# Patient Record
Sex: Male | Born: 1945 | Race: White | Hispanic: No | Marital: Married | Smoking: Never smoker
Health system: Southern US, Community
[De-identification: ages and names within clinical notes are randomized; demographics above are authoritative.]

## PROBLEM LIST (undated history)

## (undated) DIAGNOSIS — I4891 Unspecified atrial fibrillation: Secondary | ICD-10-CM

## (undated) DIAGNOSIS — J45909 Unspecified asthma, uncomplicated: Secondary | ICD-10-CM

## (undated) DIAGNOSIS — M5430 Sciatica, unspecified side: Secondary | ICD-10-CM

## (undated) DIAGNOSIS — Z98811 Dental restoration status: Secondary | ICD-10-CM

## (undated) DIAGNOSIS — C61 Malignant neoplasm of prostate: Secondary | ICD-10-CM

## (undated) DIAGNOSIS — I499 Cardiac arrhythmia, unspecified: Secondary | ICD-10-CM

## (undated) HISTORY — PX: APPENDECTOMY: SHX54

## (undated) HISTORY — PX: PROSTATECTOMY: SHX69

## (undated) HISTORY — PX: NOSE SURGERY: SHX723

## (undated) HISTORY — PX: COLONOSCOPY: SHX174

## (undated) HISTORY — PX: HERNIA REPAIR: SHX51

## (undated) HISTORY — PX: CHOLECYSTECTOMY: SHX55

---

## 2015-09-19 ENCOUNTER — Telehealth: Payer: Self-pay | Admitting: Gastroenterology

## 2015-09-19 NOTE — Telephone Encounter (Signed)
colonoscopy

## 2015-09-24 NOTE — Telephone Encounter (Signed)
LVM for pt to return my call to schedule colonoscopy.  

## 2015-09-26 NOTE — Telephone Encounter (Signed)
CAnt reach patient for colon triage, Will mail letter for patient to contact office

## 2015-10-18 ENCOUNTER — Telehealth: Payer: Self-pay | Admitting: Gastroenterology

## 2015-10-18 NOTE — Telephone Encounter (Signed)
colonoscopy

## 2015-10-25 ENCOUNTER — Other Ambulatory Visit: Payer: Self-pay

## 2015-10-25 ENCOUNTER — Telehealth: Payer: Self-pay

## 2015-10-25 NOTE — Telephone Encounter (Signed)
Pt scheduled for colonoscopy on Monday, Jan 19th in [facility]. Instructs/rx mailed. Please see if precert is required. Thanks!!

## 2015-10-25 NOTE — Telephone Encounter (Signed)
Gastroenterology Pre-Procedure Review  Request Date: 11/05/15 Requesting Physician: Dr. Hoy Morn  PATIENT REVIEW QUESTIONS: The patient responded to the following health history questions as indicated:    1. Are you having any GI issues? no 2. Do you have a personal history of Polyps? yes (Colon polyps) 3. Do you have a family history of Colon Cancer or Polyps? yes (Mother and maternal uncles ) 10. Diabetes Mellitus? no 5. Joint replacements in the past 12 months?no 6. Major health problems in the past 3 months?no 7. Any artificial heart valves, MVP, or defibrillator?no    MEDICATIONS & ALLERGIES:    Patient reports the following regarding taking any anticoagulation/antiplatelet therapy:   Plavix, Coumadin, Eliquis, Xarelto, Lovenox, Pradaxa, Brilinta, or Effient? no Aspirin? no  Patient confirms/reports the following medications:  Current Outpatient Prescriptions  Medication Sig Dispense Refill  . ADVAIR HFA 115-21 MCG/ACT inhaler INL 2 PFS PO BID. RINSE MOUTH AFTER U  4  . fluticasone (FLONASE) 50 MCG/ACT nasal spray Place into both nostrils daily.    . montelukast (SINGULAIR) 10 MG tablet TK 1 T PO QHS  2   No current facility-administered medications for this visit.    Patient confirms/reports the following allergies:  Allergies  Allergen Reactions  . Codeine   . Penicillins Hives and Swelling    No orders of the defined types were placed in this encounter.    AUTHORIZATION INFORMATION Primary Insurance: 1D#: Group #:  Secondary Insurance: 1D#: Group #:  SCHEDULE INFORMATION: Date: 11/05/15 Time: Location: [place]

## 2015-10-26 ENCOUNTER — Encounter: Payer: Self-pay | Admitting: *Deleted

## 2015-11-02 NOTE — Discharge Instructions (Signed)

## 2015-11-05 ENCOUNTER — Ambulatory Visit
Admission: RE | Admit: 2015-11-05 | Discharge: 2015-11-05 | Disposition: A | Discharge status: Home / Self Care | Payer: Medicare Other | Source: Ambulatory Visit | Attending: Gastroenterology | Admitting: Gastroenterology

## 2015-11-05 ENCOUNTER — Ambulatory Visit: Payer: Medicare Other | Admitting: Student in an Organized Health Care Education/Training Program

## 2015-11-05 ENCOUNTER — Encounter
Admission: RE | Disposition: A | Discharge status: Home / Self Care | Payer: Self-pay | Source: Ambulatory Visit | Attending: Gastroenterology

## 2015-11-05 ENCOUNTER — Other Ambulatory Visit: Payer: Self-pay | Admitting: Gastroenterology

## 2015-11-05 DIAGNOSIS — I499 Cardiac arrhythmia, unspecified: Secondary | ICD-10-CM | POA: Insufficient documentation

## 2015-11-05 DIAGNOSIS — K5730 Diverticulosis of large intestine without perforation or abscess without bleeding: Secondary | ICD-10-CM | POA: Insufficient documentation

## 2015-11-05 DIAGNOSIS — Z880 Allergy status to penicillin: Secondary | ICD-10-CM | POA: Diagnosis not present

## 2015-11-05 DIAGNOSIS — Z860100 Personal history of colon polyps, unspecified: Secondary | ICD-10-CM | POA: Insufficient documentation

## 2015-11-05 DIAGNOSIS — K641 Second degree hemorrhoids: Secondary | ICD-10-CM | POA: Insufficient documentation

## 2015-11-05 DIAGNOSIS — Z79899 Other long term (current) drug therapy: Secondary | ICD-10-CM | POA: Insufficient documentation

## 2015-11-05 DIAGNOSIS — Z9049 Acquired absence of other specified parts of digestive tract: Secondary | ICD-10-CM | POA: Insufficient documentation

## 2015-11-05 DIAGNOSIS — D120 Benign neoplasm of cecum: Secondary | ICD-10-CM | POA: Insufficient documentation

## 2015-11-05 DIAGNOSIS — J45909 Unspecified asthma, uncomplicated: Secondary | ICD-10-CM | POA: Diagnosis not present

## 2015-11-05 DIAGNOSIS — D124 Benign neoplasm of descending colon: Secondary | ICD-10-CM | POA: Insufficient documentation

## 2015-11-05 DIAGNOSIS — Z1211 Encounter for screening for malignant neoplasm of colon: Secondary | ICD-10-CM | POA: Insufficient documentation

## 2015-11-05 DIAGNOSIS — Z7951 Long term (current) use of inhaled steroids: Secondary | ICD-10-CM | POA: Diagnosis not present

## 2015-11-05 DIAGNOSIS — Z885 Allergy status to narcotic agent status: Secondary | ICD-10-CM | POA: Insufficient documentation

## 2015-11-05 HISTORY — DX: Dental restoration status: Z98.811

## 2015-11-05 HISTORY — DX: Sciatica, unspecified side: M54.30

## 2015-11-05 HISTORY — PX: COLONOSCOPY WITH PROPOFOL: SHX5780

## 2015-11-05 HISTORY — DX: Cardiac arrhythmia, unspecified: I49.9

## 2015-11-05 HISTORY — PX: POLYPECTOMY: SHX5525

## 2015-11-05 HISTORY — DX: Unspecified asthma, uncomplicated: J45.909

## 2015-11-05 SURGERY — COLONOSCOPY WITH PROPOFOL
Anesthesia: Monitor Anesthesia Care | Wound class: Contaminated

## 2015-11-05 MED ORDER — PROPOFOL 10 MG/ML IV BOLUS
INTRAVENOUS | Status: DC | PRN
  Administered 2015-11-05 (×2): 20 mg via INTRAVENOUS
  Administered 2015-11-05: 30 mg via INTRAVENOUS
  Administered 2015-11-05: 70 mg via INTRAVENOUS
  Administered 2015-11-05: 20 mg via INTRAVENOUS
  Administered 2015-11-05: 40 mg via INTRAVENOUS
  Administered 2015-11-05: 30 mg via INTRAVENOUS
  Administered 2015-11-05: 20 mg via INTRAVENOUS

## 2015-11-05 MED ORDER — STERILE WATER FOR IRRIGATION IR SOLN
Status: DC | PRN
  Administered 2015-11-05: 12:00:00

## 2015-11-05 MED ORDER — LIDOCAINE HCL (CARDIAC) 20 MG/ML IV SOLN
INTRAVENOUS | Status: DC | PRN
  Administered 2015-11-05: 50 mg via INTRAVENOUS

## 2015-11-05 MED ORDER — LACTATED RINGERS IV SOLN
INTRAVENOUS | Status: DC
  Administered 2015-11-05: 12:00:00 via INTRAVENOUS

## 2015-11-05 MED ORDER — SODIUM CHLORIDE 0.9 % IV SOLN: INTRAVENOUS | Status: DC

## 2015-11-05 SURGICAL SUPPLY — 28 items
CANISTER SUCT 1200ML W/VALVE (MISCELLANEOUS) ×3 IMPLANT
FCP ESCP3.2XJMB 240X2.8X (MISCELLANEOUS)
FORCEPS BIOP RAD 4 LRG CAP 4 (CUTTING FORCEPS) IMPLANT
FORCEPS BIOP RJ4 240 W/NDL (MISCELLANEOUS)
FORCEPS ESCP3.2XJMB 240X2.8X (MISCELLANEOUS) IMPLANT
GOWN CVR UNV OPN BCK APRN NK (MISCELLANEOUS) ×4 IMPLANT
GOWN ISOL THUMB LOOP REG UNIV (MISCELLANEOUS) ×2
HEMOCLIP INSTINCT (CLIP) IMPLANT
INJECTOR VARIJECT VIN23 (MISCELLANEOUS) IMPLANT
KIT CO2 TUBING (TUBING) IMPLANT
KIT DEFENDO VALVE AND CONN (KITS) IMPLANT
KIT ENDO PROCEDURE OLY (KITS) ×3 IMPLANT
LIGATOR MULTIBAND 6SHOOTER MBL (MISCELLANEOUS) IMPLANT
MARKER SPOT ENDO TATTOO 5ML (MISCELLANEOUS) IMPLANT
PAD GROUND ADULT SPLIT (MISCELLANEOUS) IMPLANT
SNARE SHORT THROW 13M SML OVAL (MISCELLANEOUS) ×3 IMPLANT
SNARE SHORT THROW 30M LRG OVAL (MISCELLANEOUS) IMPLANT
SPOT EX ENDOSCOPIC TATTOO (MISCELLANEOUS)
SUCTION POLY TRAP 4CHAMBER (MISCELLANEOUS) IMPLANT
TRAP SUCTION POLY (MISCELLANEOUS) ×6 IMPLANT
TUBING CONN 6MMX3.1M (TUBING)
TUBING SUCTION CONN 0.25 STRL (TUBING) IMPLANT
UNDERPAD 30X60 958B10 (PK) (MISCELLANEOUS) IMPLANT
VALVE BIOPSY ENDO (VALVE) IMPLANT
VARIJECT INJECTOR VIN23 (MISCELLANEOUS)
WATER AUXILLARY (MISCELLANEOUS) IMPLANT
WATER STERILE IRR 250ML POUR (IV SOLUTION) ×3 IMPLANT
WATER STERILE IRR 500ML POUR (IV SOLUTION) IMPLANT

## 2015-11-05 NOTE — Anesthesia Preprocedure Evaluation (Signed)
Anesthesia Evaluation  Patient identified by MRN, date of birth, ID band  Reviewed: NPO status   History of Anesthesia Complications Negative for: history of anesthetic complications  Airway Mallampati: II  TM Distance: >3 FB Neck ROM: full    Dental no notable dental hx.    Pulmonary asthma ,    Pulmonary exam normal        Cardiovascular Exercise Tolerance: Good Normal cardiovascular exam+ dysrhythmias (palpitations)      Neuro/Psych Sciatica R LEG  negative psych ROS   GI/Hepatic Neg liver ROS, GERD  Controlled,  Endo/Other  negative endocrine ROS  Renal/GU negative Renal ROS  negative genitourinary   Musculoskeletal   Abdominal   Peds  Hematology negative hematology ROS (+)   Anesthesia Other Findings   Reproductive/Obstetrics                             Anesthesia Physical Anesthesia Plan  ASA: II  Anesthesia Plan: MAC   Post-op Pain Management:    Induction:   Airway Management Planned:   Additional Equipment:   Intra-op Plan:   Post-operative Plan:   Informed Consent: I have reviewed the patients History and Physical, chart, labs and discussed the procedure including the risks, benefits and alternatives for the proposed anesthesia with the patient or authorized representative who has indicated his/her understanding and acceptance.     Plan Discussed with: CRNA  Anesthesia Plan Comments:         Anesthesia Quick Evaluation

## 2015-11-05 NOTE — Anesthesia Postprocedure Evaluation (Signed)
Anesthesia Post Note  Patient: Alejandro Jordan  Procedure(s) Performed: Procedure(s) (LRB): COLONOSCOPY WITH PROPOFOL (N/A) POLYPECTOMY  Patient location during evaluation: PACU Anesthesia Type: MAC Level of consciousness: awake and alert Pain management: pain level controlled Vital Signs Assessment: post-procedure vital signs reviewed and stable Respiratory status: spontaneous breathing, nonlabored ventilation, respiratory function stable and patient connected to nasal cannula oxygen Cardiovascular status: blood pressure returned to baseline and stable Postop Assessment: no signs of nausea or vomiting Anesthetic complications: no    Siena Poehler

## 2015-11-05 NOTE — Transfer of Care (Signed)
Immediate Anesthesia Transfer of Care Note  Patient: Karrar Balboni  Procedure(s) Performed: Procedure(s) with comments: COLONOSCOPY WITH PROPOFOL (N/A) - KEEP PT LAST  POLYPECTOMY  Patient Location: PACU  Anesthesia Type: MAC  Level of Consciousness: awake, alert  and patient cooperative  Airway and Oxygen Therapy: Patient Spontanous Breathing and Patient connected to supplemental oxygen  Post-op Assessment: Post-op Vital signs reviewed, Patient's Cardiovascular Status Stable, Respiratory Function Stable, Patent Airway and No signs of Nausea or vomiting  Post-op Vital Signs: Reviewed and stable  Complications: No apparent anesthesia complications

## 2015-11-05 NOTE — H&P (Signed)
  [facility]  [address], [place], [place] [address] Phone: [phone] Fax : [phone]  Primary Care Physician:  Hortencia Pilar, MD Primary Gastroenterologist:  Dr. Allen Norris  Pre-Procedure History & Physical: HPI:  Alejandro Jordan is a 70 y.o. male is here for an colonoscopy.   Past Medical History  Diagnosis Date  . Dysrhythmia     irregular beat - occasional - followed by PCP  . Asthma   . Sciatica     right  . Dental restoration present     implant - bottom front    Past Surgical History  Procedure Laterality Date  . Hernia repair Right     as child  . Appendectomy    . Cholecystectomy    . Nose surgery    . Colonoscopy      multiple    Prior to Admission medications   Medication Sig Start Date End Date Taking? Authorizing Provider  Cholecalciferol (VITAMIN D PO) Take by mouth daily.   Yes Historical Provider, MD  Famotidine (PEPCID PO) Take by mouth as needed.   Yes Historical Provider, MD  Multiple Vitamin (MULTIVITAMIN) tablet Take 1 tablet by mouth daily.   Yes Historical Provider, MD  OVER THE COUNTER MEDICATION daily. Lig-a-plex   Yes Historical Provider, MD  Probiotic Product (PROBIOTIC PO) Take by mouth daily.   Yes Historical Provider, MD  ADVAIR HFA 115-21 MCG/ACT inhaler INL 2 PFS PO BID. RINSE MOUTH AFTER U 09/20/15   Historical Provider, MD  fluticasone (FLONASE) 50 MCG/ACT nasal spray Place into both nostrils daily.    Historical Provider, MD  montelukast (SINGULAIR) 10 MG tablet TK 1 T PO QHS 09/25/15   Historical Provider, MD    Allergies as of 10/25/2015 - Review Complete 10/25/2015  Allergen Reaction Noted  . Codeine  10/25/2015  . Penicillins Hives and Swelling 10/25/2015    History reviewed. No pertinent family history.  Social History   Social History  . Marital Status: Married    Spouse Name: N/A  . Number of Children: N/A  . Years of Education: N/A   Occupational History  . Not on file.   Social History  Main Topics  . Smoking status: Never Smoker   . Smokeless tobacco: Not on file  . Alcohol Use: 2.4 oz/week    4 Glasses of wine per week  . Drug Use: Not on file  . Sexual Activity: Not on file   Other Topics Concern  . Not on file   Social History Narrative  . No narrative on file    Review of Systems: See HPI, otherwise negative ROS  Physical Exam: Ht 6\' 2"  (1.88 m)  Wt 248 lb (112.492 kg)  BMI 31.83 kg/m2 General:   Alert,  pleasant and cooperative in NAD Head:  Normocephalic and atraumatic. Neck:  Supple; no masses or thyromegaly. Lungs:  Clear throughout to auscultation.    Heart:  Regular rate and rhythm. Abdomen:  Soft, nontender and nondistended. Normal bowel sounds, without guarding, and without rebound.   Neurologic:  Alert and  oriented x4;  grossly normal neurologically.  Impression/Plan: Alejandro Jordan is here for an colonoscopy to be performed for hx of polyps  Risks, benefits, limitations, and alternatives regarding  colonoscopy have been reviewed with the patient.  Questions have been answered.  All parties agreeable.   Ollen Bowl, MD  11/05/2015, 10:56 AM

## 2015-11-05 NOTE — Anesthesia Procedure Notes (Signed)
Procedure Name: MAC Performed by: Billie Intriago Pre-anesthesia Checklist: Patient identified, Emergency Drugs available, Suction available, Timeout performed and Patient being monitored Patient Re-evaluated:Patient Re-evaluated prior to inductionOxygen Delivery Method: Nasal cannula Placement Confirmation: positive ETCO2       

## 2015-11-05 NOTE — Op Note (Signed)
[facility] Patient Name: Alejandro Jordan Procedure Date: 11/05/2015 11:35 AM MRN: [id number] Account #: [id number] Date of Birth: Oct 18, 1946 Admit Type: Outpatient Age: 70 Room: [facility] OR ROOM 01 Gender: Male Note Status: Finalized Procedure:         Colonoscopy Indications:       High risk colon cancer surveillance: Personal history of                     colonic polyps Providers:         Lucilla Lame, MD Referring MD:      Kerin Perna, MD (Referring MD) Medicines:         Propofol per Anesthesia Complications:     No immediate complications. Procedure:         Pre-Anesthesia Assessment:                    - Prior to the procedure, a History and Physical was                     performed, and patient medications and allergies were                     reviewed. The patient's tolerance of previous anesthesia                     was also reviewed. The risks and benefits of the procedure                     and the sedation options and risks were discussed with the                     patient. All questions were answered, and informed consent                     was obtained. Prior Anticoagulants: The patient has taken                     no previous anticoagulant or antiplatelet agents. ASA                     Grade Assessment: II - A patient with mild systemic                     disease. After reviewing the risks and benefits, the                     patient was deemed in satisfactory condition to undergo                     the procedure.                    After obtaining informed consent, the colonoscope was                     passed under direct vision. Throughout the procedure, the                     patient's blood pressure, pulse, and oxygen saturations                     were monitored continuously. The Olympus CF-HQ190L  Colonoscope (S#. [id number]) was introduced through the anus                     and advanced to  the the cecum, identified by appendiceal                     orifice and ileocecal valve. The colonoscopy was performed                     without difficulty. The patient tolerated the procedure                     well. The quality of the bowel preparation was excellent. Findings:      The perianal and digital rectal examinations were normal.      A 4 mm polyp was found in the cecum. The polyp was sessile. The polyp       was removed with a cold snare. Resection and retrieval were complete.      A 5 mm polyp was found in the descending colon. The polyp was sessile.       The polyp was removed with a cold snare. Resection and retrieval were       complete.      A few small-mouthed diverticula were found in the sigmoid colon.      Non-bleeding internal hemorrhoids were found during retroflexion. The       hemorrhoids were Grade II (internal hemorrhoids that prolapse but reduce       spontaneously). Impression:        - One 4 mm polyp in the cecum. Resected and retrieved.                    - One 5 mm polyp in the descending colon. Resected and                     retrieved.                    - Diverticulosis in the sigmoid colon.                    - Non-bleeding internal hemorrhoids. Recommendation:    - Await pathology results.                    - Repeat colonoscopy in 4 years for surveillance. Procedure Code(s): --- Professional ---                    [phone], Colonoscopy, flexible; with removal of tumor(s),                     polyp(s), or other lesion(s) by snare technique Diagnosis Code(s): --- Professional ---                    Z86.010, Personal history of colonic polyps                    D12.0, Benign neoplasm of cecum                    D12.4, Benign neoplasm of descending colon CPT copyright 2014 American Medical Association. All rights reserved. The codes documented in this report are preliminary and upon coder review may  be revised to meet current compliance  requirements. Lucilla Lame, MD 11/05/2015 12:04:41 PM This report has been signed  electronically. Number of Addenda: 0 Note Initiated On: 11/05/2015 11:35 AM Scope Withdrawal Time: 0 hours 8 minutes 47 seconds  Total Procedure Duration: 0 hours 14 minutes 28 seconds       [facility]

## 2015-11-06 ENCOUNTER — Encounter: Payer: Self-pay | Admitting: Gastroenterology

## 2015-11-08 ENCOUNTER — Encounter: Payer: Self-pay | Admitting: Gastroenterology

## 2017-09-05 ENCOUNTER — Other Ambulatory Visit: Payer: Self-pay

## 2017-09-05 ENCOUNTER — Ambulatory Visit
Admission: EM | Admit: 2017-09-05 | Discharge: 2017-09-05 | Disposition: A | Discharge status: Home / Self Care | Payer: Medicare Other | Attending: Emergency Medicine | Admitting: Emergency Medicine

## 2017-09-05 ENCOUNTER — Ambulatory Visit: Payer: Medicare Other

## 2017-09-05 DIAGNOSIS — J181 Lobar pneumonia, unspecified organism: Secondary | ICD-10-CM | POA: Diagnosis not present

## 2017-09-05 DIAGNOSIS — R0981 Nasal congestion: Secondary | ICD-10-CM | POA: Diagnosis present

## 2017-09-05 DIAGNOSIS — R05 Cough: Secondary | ICD-10-CM

## 2017-09-05 DIAGNOSIS — Z79899 Other long term (current) drug therapy: Secondary | ICD-10-CM | POA: Insufficient documentation

## 2017-09-05 DIAGNOSIS — J45909 Unspecified asthma, uncomplicated: Secondary | ICD-10-CM | POA: Insufficient documentation

## 2017-09-05 DIAGNOSIS — D124 Benign neoplasm of descending colon: Secondary | ICD-10-CM | POA: Insufficient documentation

## 2017-09-05 DIAGNOSIS — Z8546 Personal history of malignant neoplasm of prostate: Secondary | ICD-10-CM | POA: Diagnosis not present

## 2017-09-05 DIAGNOSIS — Z9049 Acquired absence of other specified parts of digestive tract: Secondary | ICD-10-CM | POA: Diagnosis not present

## 2017-09-05 DIAGNOSIS — R197 Diarrhea, unspecified: Secondary | ICD-10-CM | POA: Insufficient documentation

## 2017-09-05 DIAGNOSIS — J180 Bronchopneumonia, unspecified organism: Secondary | ICD-10-CM | POA: Diagnosis not present

## 2017-09-05 DIAGNOSIS — Z880 Allergy status to penicillin: Secondary | ICD-10-CM | POA: Insufficient documentation

## 2017-09-05 DIAGNOSIS — Z885 Allergy status to narcotic agent status: Secondary | ICD-10-CM | POA: Diagnosis not present

## 2017-09-05 DIAGNOSIS — Z886 Allergy status to analgesic agent status: Secondary | ICD-10-CM | POA: Diagnosis not present

## 2017-09-05 DIAGNOSIS — R110 Nausea: Secondary | ICD-10-CM | POA: Insufficient documentation

## 2017-09-05 DIAGNOSIS — D120 Benign neoplasm of cecum: Secondary | ICD-10-CM | POA: Diagnosis not present

## 2017-09-05 DIAGNOSIS — R079 Chest pain, unspecified: Secondary | ICD-10-CM

## 2017-09-05 DIAGNOSIS — Z9889 Other specified postprocedural states: Secondary | ICD-10-CM | POA: Insufficient documentation

## 2017-09-05 DIAGNOSIS — J189 Pneumonia, unspecified organism: Secondary | ICD-10-CM

## 2017-09-05 HISTORY — DX: Malignant neoplasm of prostate: C61

## 2017-09-05 MED ORDER — LEVOFLOXACIN 500 MG PO TABS: 500.0000 mg | ORAL_TABLET | Freq: Every day | ORAL | 0 refills | Status: DC

## 2017-09-05 MED ORDER — BENZONATATE 200 MG PO CAPS: ORAL_CAPSULE | ORAL | 0 refills | Status: DC

## 2017-09-05 NOTE — ED Triage Notes (Signed)
Patient states he has had nausea, nasal congestion, fatigue and cough since Tuesday.Patient states he had slight diarrhea on Wednesday. He has been experiencing upper middle abdominal pain radiating to his back. Patient has taken tylenol today.

## 2017-09-05 NOTE — ED Provider Notes (Signed)
[facility]    CSN: [id number] Arrival date & time: 09/05/17  1310     History   Chief Complaint Chief Complaint  Patient presents with  . Nasal Congestion    HPI Willaim Mode is a 71 y.o. male.   HPI  71 year-old male who presents with nasal congestion fatigue nausea along with mild diarrhea that started on Tuesday 4 days prior to this visit. States that last night and yesterday the congestion from his head seemed to settle into his chest. Since that time he has been coughing up thick brownish yellow sputum as well as chest pain along the diaphragm both in the front and the back. He also states that he's been having tightness that occurred sporadically lasting upwards of a minute. He has radiation into his back at the same level as the front. It was independent of his breathing or coughing. He said no diaphoresis. His fever this morning was 99.9 and he took Tylenol which did help him feel better.       Past Medical History:  Diagnosis Date  . Asthma   . Dental restoration present    implant - bottom front  . Dysrhythmia    irregular beat - occasional - followed by PCP  . Prostate cancer ([place])   . Sciatica    right    Patient Active Problem List   Diagnosis Date Noted  . Personal history of colonic polyps   . Benign neoplasm of cecum   . Benign neoplasm of descending colon     Past Surgical History:  Procedure Laterality Date  . APPENDECTOMY    . CHOLECYSTECTOMY    . COLONOSCOPY     multiple  . COLONOSCOPY WITH PROPOFOL N/A 11/05/2015   Performed by Lucilla Lame, MD at [facility]  . HERNIA REPAIR Right    as child  . NOSE SURGERY    . POLYPECTOMY  11/05/2015   Performed by Lucilla Lame, MD at [facility] Medications    Prior to Admission medications   Medication Sig Start Date End Date Taking? Authorizing Provider  ADVAIR HFA 115-21 MCG/ACT inhaler INL 2 PFS PO BID. RINSE MOUTH AFTER U 09/20/15  Yes [provider]  Cholecalciferol (VITAMIN D PO) Take by mouth daily.   Yes [provider]  Famotidine (PEPCID PO) Take by mouth as needed.   Yes [provider]  fluticasone (FLONASE) 50 MCG/ACT nasal spray Place into both nostrils daily.   Yes [provider]  Leuprolide Acetate (LUPRON IJ) Inject as directed.   Yes [provider]  Multiple Vitamin (MULTIVITAMIN) tablet Take 1 tablet by mouth daily.   Yes [provider]  Probiotic Product (PROBIOTIC PO) Take by mouth daily.   Yes [provider]  benzonatate (TESSALON) 200 MG capsule Take one cap TID PRN cough 09/05/17   Lorin Picket, PA-C  levofloxacin (LEVAQUIN) 500 MG tablet Take 1 tablet (500 mg total) daily by mouth. 09/05/17   Lorin Picket, PA-C    Family History History reviewed. No pertinent family history.  Social History Social History   Tobacco Use  . Smoking status: Never Smoker  . Smokeless tobacco: Never Used  Substance Use Topics  . Alcohol use: Yes    Alcohol/week: 2.4 oz    Types: 4 Glasses of wine per week  . Drug use: No     Allergies   Aspirin; Codeine; and Penicillins  Review of Systems Review of Systems  Constitutional: Positive for activity change, chills, fatigue and fever.  HENT: Positive for congestion.   Respiratory: Positive for cough and shortness of breath. Negative for wheezing and stridor.   Cardiovascular: Positive for chest pain.  All other systems reviewed and are negative.    Physical Exam Triage Vital Signs ED Triage Vitals  Enc Vitals Group     BP 09/05/17 1320 108/71     Pulse Rate 09/05/17 1320 83     Resp 09/05/17 1320 18     Temp 09/05/17 1320 98.7 F (37.1 C)     Temp Source 09/05/17 1320 Oral     SpO2 09/05/17 1320 98 %     Weight 09/05/17 1321 250 lb (113.4 kg)     Height 09/05/17 1321 6\' 2"  (1.88 m)     Head Circumference --      Peak Flow --      Pain Score 09/05/17 1323 3     Pain Loc --       Pain Edu? --      Excl. in [place]? --    No data found.  Updated Vital Signs BP 108/71 (BP Location: Left Arm)   Pulse 83   Temp 98.7 F (37.1 C) (Oral)   Resp 18   Ht 6\' 2"  (1.88 m)   Wt 250 lb (113.4 kg)   SpO2 98%   BMI 32.10 kg/m   Visual Acuity Right Eye Distance:   Left Eye Distance:   Bilateral Distance:    Right Eye Near:   Left Eye Near:    Bilateral Near:     Physical Exam  Constitutional: He is oriented to person, place, and time. He appears well-developed and well-nourished. No distress.  HENT:  Head: Normocephalic.  Right Ear: External ear normal.  Left Ear: External ear normal.  Nose: Nose normal.  Mouth/Throat: Oropharynx is clear and moist. No oropharyngeal exudate.  Eyes: Pupils are equal, round, and reactive to light. Right eye exhibits no discharge. Left eye exhibits no discharge.  Neck: Normal range of motion. Neck supple.  Pulmonary/Chest: Effort normal. He has rales.  Musculoskeletal: Normal range of motion.  Neurological: He is alert and oriented to person, place, and time.  Skin: Skin is warm and dry. He is not diaphoretic.  Psychiatric: He has a normal mood and affect. His behavior is normal. Judgment and thought content normal.  Nursing note and vitals reviewed.    UC Treatments / Results  Labs (all labs ordered are listed, but only abnormal results are displayed) Labs Reviewed - No data to display  EKG ED ECG REPORT   Date: 09/05/2017  EKG Time: 3:26 PM  Rate: 65  Rhythm: normal sinus rhythm, unchanged from previous tracings"}  Axis:Normal  Intervals:none  ST&T Change: No acute  Narrative Interpretation: Normal sinus rhythm without acute change            Radiology Dg Chest 2 View  Result Date: 09/05/2017 CLINICAL DATA:  Productive cough.  Nausea and fever. EXAM: CHEST  2 VIEW COMPARISON:  None. FINDINGS: Patchy opacity overlapping the heart in the lateral projection. This appears right perihilar frontally. Normal heart  size. Aortic tortuosity. Cholecystectomy clips. No edema, effusion, or pneumothorax. IMPRESSION: Bronchopneumonia on the right. Followup PA and lateral chest X-ray is recommended in 3-4 weeks following trial of antibiotic therapy to ensure resolution. Electronically Signed   By: Monte Fantasia M.D.   On: 09/05/2017 14:30    Procedures Procedures (  including critical care time)  Medications Ordered in UC Medications - No data to display   Initial Impression / Assessment and Plan / UC Course  I have reviewed the triage vital signs and the nursing notes.  Pertinent labs & imaging results that were available during my care of the patient were reviewed by me and considered in my medical decision making (see chart for details).     Plan: 1. Test/x-ray results and diagnosis reviewed with patient 2. rx as per orders; risks, benefits, potential side effects reviewed with patient 3. Recommend supportive treatment with use of Levaquin for 7 days. He'll need to follow-up with his primary care physician in 3-4 weeks for proof of resolution.Patient refused codeine cough syrups and so will use that [employer] 4. F/u prn if symptoms worsen or don't improve   Final Clinical Impressions(s) / UC Diagnoses   Final diagnoses:  Community acquired pneumonia of right lower lobe of lung [facility])    ED Discharge Orders        Ordered    benzonatate (TESSALON) 200 MG capsule     09/05/17 1449    levofloxacin (LEVAQUIN) 500 MG tablet  Daily     09/05/17 1449       Controlled Substance Prescriptions [place] Controlled Substance Registry consulted? Not Applicable   Lorin Picket, PA-C 09/05/17 1526

## 2017-09-13 ENCOUNTER — Encounter: Payer: Self-pay | Admitting: Emergency Medicine

## 2017-09-13 ENCOUNTER — Ambulatory Visit: Payer: Medicare Other

## 2017-09-13 ENCOUNTER — Ambulatory Visit
Admission: EM | Admit: 2017-09-13 | Discharge: 2017-09-13 | Disposition: A | Discharge status: Home / Self Care | Payer: Medicare Other | Attending: Emergency Medicine | Admitting: Emergency Medicine

## 2017-09-13 DIAGNOSIS — R05 Cough: Secondary | ICD-10-CM

## 2017-09-13 DIAGNOSIS — R079 Chest pain, unspecified: Secondary | ICD-10-CM | POA: Diagnosis not present

## 2017-09-13 DIAGNOSIS — R0789 Other chest pain: Secondary | ICD-10-CM | POA: Insufficient documentation

## 2017-09-13 MED ORDER — IPRATROPIUM-ALBUTEROL 0.5-2.5 (3) MG/3ML IN SOLN
3.0000 mL | Freq: Once | RESPIRATORY_TRACT | Status: AC
  Administered 2017-09-13: 3 mL via RESPIRATORY_TRACT

## 2017-09-13 NOTE — ED Triage Notes (Signed)
Was diagnosed with Pneumonia 9 days ago. Was getting better, but now has cough, congestion and chest discomfort.

## 2017-09-13 NOTE — Discharge Instructions (Signed)
Go Immediately to the [facility] emergency department.  Let them know if your pain changes, gets worse, if your shortness of breath gets worse, or for other concerns.

## 2017-09-13 NOTE — ED Provider Notes (Signed)
HPI  SUBJECTIVE:  Alejandro Jordan is a 71 y.o. male who presents with bilateral constant upper chest pain described as sore, throbbing and not as pressure or heaviness.  Started today.  He states that it is worse with deep inspiration and exertion, no alleviating factors.  He reports a returned nonproductive cough.  No hemoptysis.  No change in his shortness of breath since he was diagnosed with pneumonia.  He also reports nasal congestion, rhinorrhea, postnasal drip and some sinus pain/pressure starting yesterday.  No upper dental pain, fevers.  Wife currently has URI.  Pt was diagnosed with a right lower lobe pneumonia 12 days ago was sent home on 7 days of 500 mg of Levaquin which he states he finished.    No radiation of this pain to his neck, back, arm.  No nausea, diaphoresis.  No abdominal pain.  He tried 2 puffs from his rescue inhaler.  He has never had symptoms like this before.  He has a past medical history of metastatic prostate cancer, asthma for which he takes Advair and uses his albuterol inhaler occasionally.  He has a remote brief history of smoking.  No history of emphysema, COPD, diabetes, hypertension, hypercholesterolemia, MI, coronary artery disease, PE, DVT.  [id number], Heidi, MD  Patient was seen here on 11/17 diagnosed with right lower lobe community-acquired pneumonia, sent home with 7 days of Levaquin 500 mg, Tessalon Perles and then by his PMD on 11/19, and was improving at that time.  Repeat x-ray was scheduled for 12/10.   Past Medical History:  Diagnosis Date  . Asthma   . Dental restoration present    implant - bottom front  . Dysrhythmia    irregular beat - occasional - followed by PCP  . Prostate cancer ([place])   . Sciatica    right    Past Surgical History:  Procedure Laterality Date  . APPENDECTOMY    . CHOLECYSTECTOMY    . COLONOSCOPY     multiple  . COLONOSCOPY WITH PROPOFOL N/A 11/05/2015   Procedure: COLONOSCOPY WITH PROPOFOL;  Surgeon: Lucilla Lame,  MD;  Location: [place];  Service: Endoscopy;  Laterality: N/A;  KEEP PT LAST   . HERNIA REPAIR Right    as child  . NOSE SURGERY    . POLYPECTOMY  11/05/2015   Procedure: POLYPECTOMY;  Surgeon: Lucilla Lame, MD;  Location: [place];  Service: Endoscopy;;    History reviewed. No pertinent family history.  Social History   Tobacco Use  . Smoking status: Never Smoker  . Smokeless tobacco: Never Used  Substance Use Topics  . Alcohol use: Yes    Alcohol/week: 2.4 oz    Types: 4 Glasses of wine per week  . Drug use: No    No current facility-administered medications for this encounter.   Current Outpatient Medications:  .  ADVAIR HFA 115-21 MCG/ACT inhaler, INL 2 PFS PO BID. RINSE MOUTH AFTER U, Disp: , Rfl: 4 .  benzonatate (TESSALON) 200 MG capsule, Take one cap TID PRN cough, Disp: 30 capsule, Rfl: 0 .  Cholecalciferol (VITAMIN D PO), Take by mouth daily., Disp: , Rfl:  .  Famotidine (PEPCID PO), Take by mouth as needed., Disp: , Rfl:  .  fluticasone (FLONASE) 50 MCG/ACT nasal spray, Place into both nostrils daily., Disp: , Rfl:  .  Leuprolide Acetate (LUPRON IJ), Inject as directed., Disp: , Rfl:  .  levofloxacin (LEVAQUIN) 500 MG tablet, Take 1 tablet (500 mg total) daily by mouth., Disp:  7 tablet, Rfl: 0 .  Multiple Vitamin (MULTIVITAMIN) tablet, Take 1 tablet by mouth daily., Disp: , Rfl:  .  Probiotic Product (PROBIOTIC PO), Take by mouth daily., Disp: , Rfl:   Allergies  Allergen Reactions  . Aspirin Other (See Comments)    Pt reports:Told to avoid because of similarity to other meds that cause issues  . Codeine Hives    And swelling  . Penicillins Hives and Swelling     ROS  As noted in HPI.   Physical Exam  BP 129/89 (BP Location: Left Arm)   Pulse 96   Temp 97.7 F (36.5 C) (Oral)   Resp 18   Ht 6\' 2"  (1.88 m)   Wt 250 lb (113.4 kg)   SpO2 99%   BMI 32.10 kg/m   Constitutional: Well developed, well nourished, no acute  distress Eyes:  EOMI, conjunctiva normal bilaterally HENT: Normocephalic, atraumatic,mucus membranes moist.  Positive rhinorrhea.  No sinus tenderness.  Normal oropharynx. Respiratory: Normal inspiratory effort. good air movement,  lungs clear bilaterally.  Positive reproducible chest wall tenderness along the sternum and costochondral junctions.   Cardiovascular: Normal rate, no murmurs, rubs, gallops. GI: nondistended skin: No rash, skin intact Musculoskeletal: no deformities calves symmetric, no edema Neurologic: Alert & oriented x 3, no focal neuro deficits Psychiatric: Speech and behavior appropriate   ED Course   Medications  ipratropium-albuterol (DUONEB) 0.5-2.5 (3) MG/3ML nebulizer solution 3 mL (3 mLs Nebulization Given 09/13/17 1548)    Orders Placed This Encounter  Procedures  . DG Chest 2 View    Standing Status:   Standing    Number of Occurrences:   1    Order Specific Question:   Reason for Exam (SYMPTOM  OR DIAGNOSIS REQUIRED)    Answer:   eval for pna progression  . ED EKG    Standing Status:   Standing    Number of Occurrences:   1    Order Specific Question:   Reason for Exam    Answer:   Chest Pain  . EKG 12-Lead    Standing Status:   Standing    Number of Occurrences:   1    No results found for this or any previous visit (from the past 24 hour(s)). Dg Chest 2 View  Result Date: 09/13/2017 CLINICAL DATA:  Productive cough. EXAM: CHEST  2 VIEW COMPARISON:  Radiographs of September 05, 2017. FINDINGS: The heart size and mediastinal contours are within normal limits. Both lungs are clear. No pneumothorax or pleural effusion is noted. The visualized skeletal structures are unremarkable. IMPRESSION: No active cardiopulmonary disease. Electronically Signed   By: Marijo Conception, M.D.   On: 09/13/2017 15:47    ED Clinical Impression  Chest pain, unspecified type   ED Assessment/Plan  Checking chest x-ray, EKG.  He does have reproducible chest wall  tenderness and given his recent history of pneumonia with coughing and history of asthma, favor musculoskeletal chest pain. Doubt PE. Will give DuoNeb and reevaluate.  EKG: Normal sinus rhythm with a single PVC.  Rate 69.  Normal axis, normal intervals.  No hypertrophy.  No ST-T wave changes compared to previous EKG from 09/05/2017  Reviewed imaging independently.  No pneumonia.  Normal chest x-ray.  See radiology report for full details.  Reevaluation, patient states that his chest pain has not changed.  This seems to be musculoskeletal, however, given his age, this could be atypical chest pain.  After talking with the patient, we have decided that  he should go to [facility] for a repeat EKG and comprehensive evaluation.  Feel the patient is stable to go by private vehicle.  Discussed imaging, MDM, plan and followup with patient. Discussed sn/sx that should prompt return to the ED. patient agrees with plan.   Meds ordered this encounter  Medications  . ipratropium-albuterol (DUONEB) 0.5-2.5 (3) MG/3ML nebulizer solution 3 mL    *This clinic note was created using [employer]. Therefore, there may be occasional mistakes despite careful proofreading.   ?   Melynda Ripple, MD 09/13/17 [phone]

## 2020-01-26 ENCOUNTER — Ambulatory Visit (INDEPENDENT_AMBULATORY_CARE_PROVIDER_SITE_OTHER): Payer: Self-pay | Admitting: Gastroenterology

## 2020-01-26 ENCOUNTER — Telehealth: Payer: Self-pay

## 2020-01-26 DIAGNOSIS — Z86010 Personal history of colonic polyps: Secondary | ICD-10-CM

## 2020-01-26 NOTE — Progress Notes (Signed)
Gastroenterology Pre-Procedure Review  Request Date: Monday 03/12/20 Requesting Physician: Dr. Allen Norris  PATIENT REVIEW QUESTIONS: The patient responded to the following health history questions as indicated:    1. Are you having any GI issues? no 2. Do you have a personal history of Polyps? yes (2017 Dr. Allen Norris) 3. Do you have a family history of Colon Cancer or Polyps? yes (mother and father colon cancer) 4. Diabetes Mellitus? no 5. Joint replacements in the past 12 months?no 6. Major health problems in the past 3 months?no 7. Any artificial heart valves, MVP, or defibrillator?no    MEDICATIONS & ALLERGIES:    Patient reports the following regarding taking any anticoagulation/antiplatelet therapy:   Plavix, Coumadin, Eliquis, Xarelto, Lovenox, Pradaxa, Brilinta, or Effient? no Aspirin? no  Patient confirms/reports the following medications:  Current Outpatient Medications  Medication Sig Dispense Refill  . ADVAIR HFA 115-21 MCG/ACT inhaler INL 2 PFS PO BID. RINSE MOUTH AFTER U  4  . benzonatate (TESSALON) 200 MG capsule Take one cap TID PRN cough 30 capsule 0  . Cholecalciferol (VITAMIN D PO) Take by mouth daily.    . Famotidine (PEPCID PO) Take by mouth as needed.    . fluticasone (FLONASE) 50 MCG/ACT nasal spray Place into both nostrils daily.    Marland Kitchen Leuprolide Acetate (LUPRON IJ) Inject as directed.    Marland Kitchen levofloxacin (LEVAQUIN) 500 MG tablet Take 1 tablet (500 mg total) daily by mouth. 7 tablet 0  . Multiple Vitamin (MULTIVITAMIN) tablet Take 1 tablet by mouth daily.    . Probiotic Product (PROBIOTIC PO) Take by mouth daily.     No current facility-administered medications for this visit.    Patient confirms/reports the following allergies:  Allergies  Allergen Reactions  . Aspirin Other (See Comments)    Pt reports:Told to avoid because of similarity to other meds that cause issues  . Cetirizine   . Codeine Hives    And swelling  . Fluticasone-Salmeterol   . Other   .  Penicillins Hives and Swelling    No orders of the defined types were placed in this encounter.   AUTHORIZATION INFORMATION Primary Insurance: 1D#: Group #:  Secondary Insurance: 1D#: Group #:  SCHEDULE INFORMATION: Date: Monday 03/12/20 Time: Location:[facility]

## 2020-01-26 NOTE — Telephone Encounter (Signed)
Opened in error

## 2020-03-01 ENCOUNTER — Other Ambulatory Visit: Payer: Self-pay

## 2020-03-01 ENCOUNTER — Encounter: Payer: Self-pay | Admitting: Gastroenterology

## 2020-03-08 ENCOUNTER — Other Ambulatory Visit
Admission: RE | Admit: 2020-03-08 | Discharge: 2020-03-08 | Disposition: A | Discharge status: Home / Self Care | Payer: Medicare Other | Source: Ambulatory Visit | Attending: Gastroenterology | Admitting: Gastroenterology

## 2020-03-08 DIAGNOSIS — Z01812 Encounter for preprocedural laboratory examination: Secondary | ICD-10-CM | POA: Insufficient documentation

## 2020-03-08 DIAGNOSIS — Z20822 Contact with and (suspected) exposure to covid-19: Secondary | ICD-10-CM | POA: Insufficient documentation

## 2020-03-09 LAB — SARS CORONAVIRUS 2 (TAT 6-24 HRS): SARS Coronavirus 2: NEGATIVE

## 2020-03-09 NOTE — Discharge Instructions (Signed)
General Anesthesia, Adult, Care After This sheet gives you information about how to care for yourself after your procedure. Your health care provider may also give you more specific instructions. If you have problems or questions, contact your health care provider. What can I expect after the procedure? After the procedure, the following side effects are common:  Pain or discomfort at the IV site.  Nausea.  Vomiting.  Sore throat.  Trouble concentrating.  Feeling cold or chills.  Weak or tired.  Sleepiness and fatigue.  Soreness and body aches. These side effects can affect parts of the body that were not involved in surgery. Follow these instructions at home:  For at least 24 hours after the procedure:  Have a responsible adult stay with you. It is important to have someone help care for you until you are awake and alert.  Rest as needed.  Do not: ? Participate in activities in which you could fall or become injured. ? Drive. ? Use heavy machinery. ? Drink alcohol. ? Take sleeping pills or medicines that cause drowsiness. ? Make important decisions or sign legal documents. ? Take care of children on your own. Eating and drinking  Follow any instructions from your health care provider about eating or drinking restrictions.  When you feel hungry, start by eating small amounts of foods that are soft and easy to digest (bland), such as toast. Gradually return to your regular diet.  Drink enough fluid to keep your urine pale yellow.  If you vomit, rehydrate by drinking water, juice, or clear broth. General instructions  If you have sleep apnea, surgery and certain medicines can increase your risk for breathing problems. Follow instructions from your health care provider about wearing your sleep device: ? Anytime you are sleeping, including during daytime naps. ? While taking prescription pain medicines, sleeping medicines, or medicines that make you drowsy.  Return to  your normal activities as told by your health care provider. Ask your health care provider what activities are safe for you.  Take over-the-counter and prescription medicines only as told by your health care provider.  If you smoke, do not smoke without supervision.  Keep all follow-up visits as told by your health care provider. This is important. Contact a health care provider if:  You have nausea or vomiting that does not get better with medicine.  You cannot eat or drink without vomiting.  You have pain that does not get better with medicine.  You are unable to pass urine.  You develop a skin rash.  You have a fever.  You have redness around your IV site that gets worse. Get help right away if:  You have difficulty breathing.  You have chest pain.  You have blood in your urine or stool, or you vomit blood. Summary  After the procedure, it is common to have a sore throat or nausea. It is also common to feel tired.  Have a responsible adult stay with you for the first 24 hours after general anesthesia. It is important to have someone help care for you until you are awake and alert.  When you feel hungry, start by eating small amounts of foods that are soft and easy to digest (bland), such as toast. Gradually return to your regular diet.  Drink enough fluid to keep your urine pale yellow.  Return to your normal activities as told by your health care provider. Ask your health care provider what activities are safe for you. This information is not   intended to replace advice given to you by your health care provider. Make sure you discuss any questions you have with your health care provider. Document Revised: 10/09/2017 Document Reviewed: 05/22/2017 Elsevier Patient Education  2020 Elsevier Inc[employer]  

## 2020-03-12 ENCOUNTER — Encounter: Payer: Self-pay | Admitting: Gastroenterology

## 2020-03-12 ENCOUNTER — Ambulatory Visit: Payer: Medicare Other | Admitting: Anesthesiology

## 2020-03-12 ENCOUNTER — Ambulatory Visit
Admission: RE | Admit: 2020-03-12 | Discharge: 2020-03-12 | Disposition: A | Discharge status: Home / Self Care | Payer: Medicare Other | Attending: Gastroenterology | Admitting: Gastroenterology

## 2020-03-12 ENCOUNTER — Other Ambulatory Visit: Payer: Self-pay

## 2020-03-12 ENCOUNTER — Encounter
Admission: RE | Disposition: A | Discharge status: Home / Self Care | Payer: Self-pay | Source: Home / Self Care | Attending: Gastroenterology

## 2020-03-12 DIAGNOSIS — K5790 Diverticulosis of intestine, part unspecified, without perforation or abscess without bleeding: Secondary | ICD-10-CM | POA: Diagnosis not present

## 2020-03-12 DIAGNOSIS — Z885 Allergy status to narcotic agent status: Secondary | ICD-10-CM | POA: Insufficient documentation

## 2020-03-12 DIAGNOSIS — J45909 Unspecified asthma, uncomplicated: Secondary | ICD-10-CM | POA: Insufficient documentation

## 2020-03-12 DIAGNOSIS — Z8546 Personal history of malignant neoplasm of prostate: Secondary | ICD-10-CM | POA: Insufficient documentation

## 2020-03-12 DIAGNOSIS — K640 First degree hemorrhoids: Secondary | ICD-10-CM | POA: Diagnosis not present

## 2020-03-12 DIAGNOSIS — Z86010 Personal history of colonic polyps: Secondary | ICD-10-CM | POA: Insufficient documentation

## 2020-03-12 DIAGNOSIS — D123 Benign neoplasm of transverse colon: Secondary | ICD-10-CM | POA: Diagnosis not present

## 2020-03-12 DIAGNOSIS — Z1211 Encounter for screening for malignant neoplasm of colon: Secondary | ICD-10-CM | POA: Diagnosis present

## 2020-03-12 DIAGNOSIS — Z886 Allergy status to analgesic agent status: Secondary | ICD-10-CM | POA: Diagnosis not present

## 2020-03-12 DIAGNOSIS — K635 Polyp of colon: Secondary | ICD-10-CM

## 2020-03-12 DIAGNOSIS — Z880 Allergy status to penicillin: Secondary | ICD-10-CM | POA: Diagnosis not present

## 2020-03-12 DIAGNOSIS — Z79899 Other long term (current) drug therapy: Secondary | ICD-10-CM | POA: Insufficient documentation

## 2020-03-12 HISTORY — PX: COLONOSCOPY WITH PROPOFOL: SHX5780

## 2020-03-12 HISTORY — PX: POLYPECTOMY: SHX5525

## 2020-03-12 SURGERY — COLONOSCOPY WITH PROPOFOL
Anesthesia: General | Site: Rectum

## 2020-03-12 MED ORDER — LACTATED RINGERS IV SOLN: INTRAVENOUS | Status: DC

## 2020-03-12 MED ORDER — PROPOFOL 10 MG/ML IV BOLUS
INTRAVENOUS | Status: DC | PRN
  Administered 2020-03-12: 100 mg via INTRAVENOUS
  Administered 2020-03-12: 50 mg via INTRAVENOUS
  Administered 2020-03-12: 30 mg via INTRAVENOUS
  Administered 2020-03-12 (×2): 50 mg via INTRAVENOUS

## 2020-03-12 MED ORDER — LIDOCAINE HCL (CARDIAC) PF 100 MG/5ML IV SOSY
PREFILLED_SYRINGE | INTRAVENOUS | Status: DC | PRN
  Administered 2020-03-12: 30 mg via INTRAVENOUS

## 2020-03-12 MED ORDER — STERILE WATER FOR IRRIGATION IR SOLN
Status: DC | PRN
  Administered 2020-03-12: .05 mL

## 2020-03-12 SURGICAL SUPPLY — 6 items
FORCEPS BIOP RAD 4 LRG CAP 4 (CUTTING FORCEPS) ×1 IMPLANT
GOWN CVR UNV OPN BCK APRN NK (MISCELLANEOUS) ×4 IMPLANT
GOWN ISOL THUMB LOOP REG UNIV (MISCELLANEOUS) ×2
KIT ENDO PROCEDURE OLY (KITS) ×3 IMPLANT
MANIFOLD NEPTUNE II (INSTRUMENTS) ×1 IMPLANT
WATER STERILE IRR 250ML POUR (IV SOLUTION) ×3 IMPLANT

## 2020-03-12 NOTE — Anesthesia Procedure Notes (Signed)
Date/Time: 03/12/2020 7:40 AM Performed by: Cameron Ali, CRNA Pre-anesthesia Checklist: Patient identified, Emergency Drugs available, Suction available, Timeout performed and Patient being monitored Patient Re-evaluated:Patient Re-evaluated prior to induction Oxygen Delivery Method: Nasal cannula Placement Confirmation: positive ETCO2

## 2020-03-12 NOTE — Anesthesia Preprocedure Evaluation (Signed)
Anesthesia Evaluation  Patient identified by MRN, date of birth, ID band Patient awake    Reviewed: Allergy & Precautions, H&P , NPO status , Patient's Chart, lab work & pertinent test results, reviewed documented beta blocker date and time   Airway Mallampati: II  TM Distance: >3 FB Neck ROM: full    Dental no notable dental hx.  Implant - bottom front:   Pulmonary asthma ,    Pulmonary exam normal breath sounds clear to auscultation       Cardiovascular Exercise Tolerance: Good + dysrhythmias (occasional irreg heartbeat)  Rhythm:regular Rate:Normal     Neuro/Psych  Neuromuscular disease (sciatica) negative psych ROS   GI/Hepatic negative GI ROS, Neg liver ROS,   Endo/Other  negative endocrine ROS  Renal/GU negative Renal ROS  negative genitourinary   Musculoskeletal   Abdominal   Peds  Hematology negative hematology ROS (+)   Anesthesia Other Findings   Reproductive/Obstetrics negative OB ROS                             Anesthesia Physical Anesthesia Plan  ASA: II  Anesthesia Plan: General   Post-op Pain Management:    Induction:   PONV Risk Score and Plan: TIVA and Propofol infusion  Airway Management Planned:   Additional Equipment:   Intra-op Plan:   Post-operative Plan:   Informed Consent: I have reviewed the patients History and Physical, chart, labs and discussed the procedure including the risks, benefits and alternatives for the proposed anesthesia with the patient or authorized representative who has indicated his/her understanding and acceptance.     Dental Advisory Given  Plan Discussed with: CRNA  Anesthesia Plan Comments:         Anesthesia Quick Evaluation

## 2020-03-12 NOTE — H&P (Signed)
Lucilla Lame, MD [address] [place], [place] [address] [phone] Fax : [phone]  Primary Care Physician:  Hortencia Pilar, MD Primary Gastroenterologist:  Dr. Allen Norris  Pre-Procedure History & Physical: HPI:  Alejandro Jordan is a 74 y.o. male is here for an colonoscopy.   Past Medical History:  Diagnosis Date  . Asthma   . Dental restoration present    implant - bottom front  . Dysrhythmia    irregular beat - occasional - followed by PCP  . Prostate cancer ([place])   . Sciatica    right    Past Surgical History:  Procedure Laterality Date  . APPENDECTOMY    . CHOLECYSTECTOMY    . COLONOSCOPY     multiple  . COLONOSCOPY WITH PROPOFOL N/A 11/05/2015   Procedure: COLONOSCOPY WITH PROPOFOL;  Surgeon: Lucilla Lame, MD;  Location: [place];  Service: Endoscopy;  Laterality: N/A;  KEEP PT LAST   . HERNIA REPAIR Right    as child  . NOSE SURGERY    . POLYPECTOMY  11/05/2015   Procedure: POLYPECTOMY;  Surgeon: Lucilla Lame, MD;  Location: [place];  Service: Endoscopy;;  . PROSTATECTOMY      Prior to Admission medications   Medication Sig Start Date End Date Taking? Authorizing Provider  ADVAIR HFA 115-21 MCG/ACT inhaler INL 2 PFS PO BID. RINSE MOUTH AFTER U 09/20/15  Yes [provider]  Cholecalciferol (VITAMIN D PO) Take by mouth daily.   Yes [provider]  Famotidine (PEPCID PO) Take by mouth as needed.   Yes [provider]  fluticasone (FLONASE) 50 MCG/ACT nasal spray Place into both nostrils daily.   Yes [provider]  Multiple Vitamin (MULTIVITAMIN) tablet Take 1 tablet by mouth daily.   Yes [provider]  benzonatate (TESSALON) 200 MG capsule Take one cap TID PRN cough Patient not taking: Reported on 03/01/2020 09/05/17   Lorin Picket, PA-C  Leuprolide Acetate (LUPRON IJ) Inject as directed.    [provider]  levofloxacin (LEVAQUIN) 500 MG tablet Take 1 tablet  (500 mg total) daily by mouth. Patient not taking: Reported on 03/01/2020 09/05/17   Lorin Picket, PA-C  Probiotic Product (PROBIOTIC PO) Take by mouth daily.    [provider]    Allergies as of 01/26/2020 - Review Complete 09/13/2017  Allergen Reaction Noted  . Aspirin Other (See Comments) 10/26/2015  . Cetirizine  01/26/2020  . Codeine Hives 10/25/2015  . Fluticasone-salmeterol  01/26/2020  . Other  01/26/2020  . Penicillins Hives and Swelling 10/25/2015    History reviewed. No pertinent family history.  Social History   Socioeconomic History  . Marital status: Married    Spouse name: Not on file  . Number of children: Not on file  . Years of education: Not on file  . Highest education level: Not on file  Occupational History  . Not on file  Tobacco Use  . Smoking status: Never Smoker  . Smokeless tobacco: Never Used  Substance and Sexual Activity  . Alcohol use: Yes    Alcohol/week: 7.0 standard drinks    Types: 7 Glasses of wine per week  . Drug use: No  . Sexual activity: Not on file  Other Topics Concern  . Not on file  Social History Narrative  . Not on file   Social Determinants of Health   Financial Resource Strain:   . Difficulty of Paying Living Expenses:   Food Insecurity:   .  Worried About Charity fundraiser in the Last Year:   . Arboriculturist in the Last Year:   Transportation Needs:   . Film/video editor (Medical):   Marland Kitchen Lack of Transportation (Non-Medical):   Physical Activity:   . Days of Exercise per Week:   . Minutes of Exercise per Session:   Stress:   . Feeling of Stress :   Social Connections:   . Frequency of Communication with Friends and Family:   . Frequency of Social Gatherings with Friends and Family:   . Attends Religious Services:   . Active Member of Clubs or Organizations:   . Attends Archivist Meetings:   Marland Kitchen Marital Status:   Intimate Partner Violence:   . Fear of Current or Ex-Partner:     . Emotionally Abused:   Marland Kitchen Physically Abused:   . Sexually Abused:     Review of Systems: See HPI, otherwise negative ROS  Physical Exam: BP (!) 153/70   Pulse 68   Temp (!) 97.3 F (36.3 C) (Temporal)   Resp 16   Ht 6\' 2"  (1.88 m)   Wt 111.6 kg   SpO2 98%   BMI 31.58 kg/m  General:   Alert,  pleasant and cooperative in NAD Head:  Normocephalic and atraumatic. Neck:  Supple; no masses or thyromegaly. Lungs:  Clear throughout to auscultation.    Heart:  Regular rate and rhythm. Abdomen:  Soft, nontender and nondistended. Normal bowel sounds, without guarding, and without rebound.   Neurologic:  Alert and  oriented x4;  grossly normal neurologically.  Impression/Plan: Alejandro Jordan is here for an colonoscopy to be performed for history of adnematous polyps last colonoscopy 10/2015.  Risks, benefits, limitations, and alternatives regarding  colonoscopy have been reviewed with the patient.  Questions have been answered.  All parties agreeable.   Lucilla Lame, MD  03/12/2020, 7:30 AM

## 2020-03-12 NOTE — Op Note (Signed)
[facility] Gastroenterology Patient Name: Alejandro Jordan Procedure Date: 03/12/2020 7:20 AM MRN: [id number] Account #: [id number] Date of Birth: 03-02-1946 Admit Type: Outpatient Age: 74 Room: [facility] OR ROOM 01 Gender: Male Note Status: Finalized Procedure:             Colonoscopy Indications:           High risk colon cancer surveillance: Personal history                         of colonic polyps Providers:             Lucilla Lame MD, MD Referring MD:          Kerin Perna MD, MD (Referring MD) Medicines:             Propofol per Anesthesia Complications:         No immediate complications. Procedure:             Pre-Anesthesia Assessment:                        - Prior to the procedure, a History and Physical was                         performed, and patient medications and allergies were                         reviewed. The patient's tolerance of previous                         anesthesia was also reviewed. The risks and benefits                         of the procedure and the sedation options and risks                         were discussed with the patient. All questions were                         answered, and informed consent was obtained. Prior                         Anticoagulants: The patient has taken no previous                         anticoagulant or antiplatelet agents. ASA Grade                         Assessment: II - A patient with mild systemic disease.                         After reviewing the risks and benefits, the patient                         was deemed in satisfactory condition to undergo the                         procedure.  After obtaining informed consent, the colonoscope was                         passed under direct vision. Throughout the procedure,                         the patient's blood pressure, pulse, and oxygen                         saturations were monitored continuously. The was                     introduced through the anus and advanced to the the                         cecum, identified by appendiceal orifice and ileocecal                         valve. The colonoscopy was performed without                         difficulty. The patient tolerated the procedure well.                         The quality of the bowel preparation was excellent. Findings:      The perianal and digital rectal examinations were normal.      A 4 mm polyp was found in the transverse colon. The polyp was sessile.       The polyp was removed with a cold biopsy forceps. Resection and       retrieval were complete.      A 3 mm polyp was found in the sigmoid colon. The polyp was sessile. The       polyp was removed with a cold biopsy forceps. Resection and retrieval       were complete.      Multiple small-mouthed diverticula were found in the sigmoid colon.      Non-bleeding internal hemorrhoids were found during retroflexion. The       hemorrhoids were Grade I (internal hemorrhoids that do not prolapse). Impression:            - One 4 mm polyp in the transverse colon, removed with                         a cold biopsy forceps. Resected and retrieved.                        - One 3 mm polyp in the sigmoid colon, removed with a                         cold biopsy forceps. Resected and retrieved.                        - Diverticulosis in the sigmoid colon.                        - Non-bleeding internal hemorrhoids. Recommendation:        - Discharge patient to home.                        -  Resume previous diet.                        - Continue present medications.                        - Await pathology results.                        - Repeat colonoscopy in 5 years for surveillance. Procedure Code(s):     --- Professional ---                        [phone], Colonoscopy, flexible; with biopsy, single or                         multiple Diagnosis Code(s):     --- Professional ---                         Z86.010, Personal history of colonic polyps                        K63.5, Polyp of colon CPT copyright 2019 American Medical Association. All rights reserved. The codes documented in this report are preliminary and upon coder review may  be revised to meet current compliance requirements. Lucilla Lame MD, MD 03/12/2020 8:03:25 AM This report has been signed electronically. Number of Addenda: 0 Note Initiated On: 03/12/2020 7:20 AM Scope Withdrawal Time: 0 hours 10 minutes 10 seconds  Total Procedure Duration: 0 hours 16 minutes 16 seconds  Estimated Blood Loss:  Estimated blood loss: none.      [facility]

## 2020-03-12 NOTE — Anesthesia Postprocedure Evaluation (Signed)
Anesthesia Post Note  Patient: Alejandro Jordan  Procedure(s) Performed: COLONOSCOPY WITH PROPOFOL (N/A Rectum) POLYPECTOMY (Rectum)     Patient location during evaluation: PACU Anesthesia Type: General Level of consciousness: awake and alert Pain management: pain level controlled Vital Signs Assessment: post-procedure vital signs reviewed and stable Respiratory status: spontaneous breathing, nonlabored ventilation, respiratory function stable and patient connected to nasal cannula oxygen Cardiovascular status: blood pressure returned to baseline and stable Postop Assessment: no apparent nausea or vomiting Anesthetic complications: no    Alisa Graff

## 2020-03-12 NOTE — Transfer of Care (Signed)
Immediate Anesthesia Transfer of Care Note  Patient: Alejandro Jordan  Procedure(s) Performed: COLONOSCOPY WITH PROPOFOL (N/A Rectum) POLYPECTOMY (Rectum)  Patient Location: PACU  Anesthesia Type: General  Level of Consciousness: awake, alert  and patient cooperative  Airway and Oxygen Therapy: Patient Spontanous Breathing and Patient connected to supplemental oxygen  Post-op Assessment: Post-op Vital signs reviewed, Patient's Cardiovascular Status Stable, Respiratory Function Stable, Patent Airway and No signs of Nausea or vomiting  Post-op Vital Signs: Reviewed and stable  Complications: No apparent anesthesia complications

## 2020-03-13 ENCOUNTER — Encounter: Payer: Self-pay | Admitting: *Deleted

## 2020-03-14 ENCOUNTER — Encounter: Payer: Self-pay | Admitting: Gastroenterology

## 2020-03-14 LAB — SURGICAL PATHOLOGY

## 2020-12-09 ENCOUNTER — Emergency Department: Payer: Medicare Other

## 2020-12-09 ENCOUNTER — Encounter: Payer: Self-pay | Admitting: Radiology

## 2020-12-09 ENCOUNTER — Other Ambulatory Visit: Payer: Self-pay

## 2020-12-09 ENCOUNTER — Emergency Department
Admission: EM | Admit: 2020-12-09 | Discharge: 2020-12-10 | Disposition: A | Discharge status: Home / Self Care | Payer: Medicare Other | Attending: Emergency Medicine | Admitting: Emergency Medicine

## 2020-12-09 DIAGNOSIS — Z8546 Personal history of malignant neoplasm of prostate: Secondary | ICD-10-CM | POA: Diagnosis not present

## 2020-12-09 DIAGNOSIS — J45909 Unspecified asthma, uncomplicated: Secondary | ICD-10-CM | POA: Diagnosis not present

## 2020-12-09 DIAGNOSIS — W2105XA Struck by basketball, initial encounter: Secondary | ICD-10-CM | POA: Diagnosis not present

## 2020-12-09 DIAGNOSIS — Z7951 Long term (current) use of inhaled steroids: Secondary | ICD-10-CM | POA: Diagnosis not present

## 2020-12-09 DIAGNOSIS — Y9367 Activity, basketball: Secondary | ICD-10-CM | POA: Insufficient documentation

## 2020-12-09 DIAGNOSIS — S299XXA Unspecified injury of thorax, initial encounter: Secondary | ICD-10-CM | POA: Diagnosis present

## 2020-12-09 DIAGNOSIS — S199XXA Unspecified injury of neck, initial encounter: Secondary | ICD-10-CM | POA: Diagnosis not present

## 2020-12-09 DIAGNOSIS — I712 Thoracic aortic aneurysm, without rupture, unspecified: Secondary | ICD-10-CM

## 2020-12-09 DIAGNOSIS — S0990XA Unspecified injury of head, initial encounter: Secondary | ICD-10-CM | POA: Insufficient documentation

## 2020-12-09 DIAGNOSIS — Z85038 Personal history of other malignant neoplasm of large intestine: Secondary | ICD-10-CM | POA: Diagnosis not present

## 2020-12-09 DIAGNOSIS — W19XXXA Unspecified fall, initial encounter: Secondary | ICD-10-CM

## 2020-12-09 DIAGNOSIS — S2231XA Fracture of one rib, right side, initial encounter for closed fracture: Secondary | ICD-10-CM | POA: Diagnosis not present

## 2020-12-09 DIAGNOSIS — J189 Pneumonia, unspecified organism: Secondary | ICD-10-CM

## 2020-12-09 LAB — CBC WITH DIFFERENTIAL/PLATELET
Abs Immature Granulocytes: 0.03 10*3/uL (ref 0.00–0.07)
Basophils Absolute: 0 10*3/uL (ref 0.0–0.1)
Basophils Relative: 1 %
Eosinophils Absolute: 0.3 10*3/uL (ref 0.0–0.5)
Eosinophils Relative: 4 %
HCT: 42.8 % (ref 39.0–52.0)
Hemoglobin: 14.3 g/dL (ref 13.0–17.0)
Immature Granulocytes: 0 %
Lymphocytes Relative: 15 %
Lymphs Abs: 1.1 10*3/uL (ref 0.7–4.0)
MCH: 30.8 pg (ref 26.0–34.0)
MCHC: 33.4 g/dL (ref 30.0–36.0)
MCV: 92 fL (ref 80.0–100.0)
Monocytes Absolute: 0.6 10*3/uL (ref 0.1–1.0)
Monocytes Relative: 8 %
Neutro Abs: 5.5 10*3/uL (ref 1.7–7.7)
Neutrophils Relative %: 72 %
Platelets: 191 10*3/uL (ref 150–400)
RBC: 4.65 MIL/uL (ref 4.22–5.81)
RDW: 12.4 % (ref 11.5–15.5)
WBC: 7.6 10*3/uL (ref 4.0–10.5)
nRBC: 0 % (ref 0.0–0.2)

## 2020-12-09 LAB — COMPREHENSIVE METABOLIC PANEL
ALT: 23 U/L (ref 0–44)
AST: 24 U/L (ref 15–41)
Albumin: 4 g/dL (ref 3.5–5.0)
Alkaline Phosphatase: 49 U/L (ref 38–126)
Anion gap: 7 (ref 5–15)
BUN: 16 mg/dL (ref 8–23)
CO2: 28 mmol/L (ref 22–32)
Calcium: 8.6 mg/dL — ABNORMAL LOW (ref 8.9–10.3)
Chloride: 105 mmol/L (ref 98–111)
Creatinine, Ser: 0.82 mg/dL (ref 0.61–1.24)
GFR, Estimated: >60 mL/min (ref >60)
Glucose, Bld: 126 mg/dL — ABNORMAL HIGH (ref 70–99)
Potassium: 3.8 mmol/L (ref 3.5–5.1)
Sodium: 140 mmol/L (ref 135–145)
Total Bilirubin: 0.7 mg/dL (ref 0.3–1.2)
Total Protein: 7.3 g/dL (ref 6.5–8.1)

## 2020-12-09 MED ORDER — LIDOCAINE 5 % EX PTCH
1.0000 | MEDICATED_PATCH | CUTANEOUS | Status: DC
  Administered 2020-12-09: 1 via TRANSDERMAL
  Filled 2020-12-09: qty 1

## 2020-12-09 MED ORDER — DOXYCYCLINE HYCLATE 100 MG PO CAPS: 100.0000 mg | ORAL_CAPSULE | Freq: Two times a day (BID) | ORAL | 0 refills | Status: AC

## 2020-12-09 MED ORDER — IOHEXOL 350 MG/ML SOLN
100.0000 mL | Freq: Once | INTRAVENOUS | Status: AC | PRN
  Administered 2020-12-09: 100 mL via INTRAVENOUS

## 2020-12-09 MED ORDER — LIDOCAINE 5 % EX PTCH: 1.0000 | MEDICATED_PATCH | Freq: Two times a day (BID) | CUTANEOUS | 0 refills | Status: AC

## 2020-12-09 MED ORDER — DOXYCYCLINE HYCLATE 100 MG PO CAPS: 100.0000 mg | ORAL_CAPSULE | Freq: Two times a day (BID) | ORAL | 0 refills | Status: DC

## 2020-12-09 MED ORDER — OXYCODONE-ACETAMINOPHEN 5-325 MG PO TABS: 1.0000 | ORAL_TABLET | ORAL | 0 refills | Status: AC | PRN

## 2020-12-09 NOTE — ED Notes (Signed)
Upon assessment pt states he has a challenging time expanding when exhaling. Pt notes when he fell he heard a snap. Pt denies any chest pain or SOB. Upon skin assessment no bruising  is noted

## 2020-12-09 NOTE — ED Provider Notes (Signed)
[facility] Emergency Department Provider Note  ____________________________________________  Time seen: Approximately 8:08 PM  I have reviewed the triage vital signs and the nursing notes.   HISTORY  Chief Complaint Rib Injury (Accidental fall , Complaining of right rib pain . )    HPI Alejandro Jordan is a 75 y.o. male who presents the emergency department after a mechanical fall while playing basketball with his grandchild. Patient fell onto his right knee, elbow and chest wall. His primary complaint was chest wall pain. He did hit his head but did not lose consciousness. Was not complaining of any neck pain. Initially there was no other complaint but during his stay he noticed that his right upper extremity was cooler to the touch than the left. He states that it also felt cold to him. He had full range of motion to the right upper extremity however. No other reported injury or complaint. No medications prior to arrival. Medical history as described below with no complaints.         Past Medical History:  Diagnosis Date  . Asthma   . Dental restoration present    implant - bottom front  . Dysrhythmia    irregular beat - occasional - followed by PCP  . Prostate cancer ([place])   . Sciatica    right    Patient Active Problem List   Diagnosis Date Noted  . Polyp of transverse colon   . Personal history of colonic polyps   . Benign neoplasm of cecum   . Benign neoplasm of descending colon     Past Surgical History:  Procedure Laterality Date  . APPENDECTOMY    . CHOLECYSTECTOMY    . COLONOSCOPY     multiple  . COLONOSCOPY WITH PROPOFOL N/A 11/05/2015   Procedure: COLONOSCOPY WITH PROPOFOL;  Surgeon: Lucilla Lame, MD;  Location: [place];  Service: Endoscopy;  Laterality: N/A;  KEEP PT LAST   . COLONOSCOPY WITH PROPOFOL N/A 03/12/2020   Procedure: COLONOSCOPY WITH PROPOFOL;  Surgeon: Lucilla Lame, MD;  Location: [place];  Service:  Endoscopy;  Laterality: N/A;  priority 3  . HERNIA REPAIR Right    as child  . NOSE SURGERY    . POLYPECTOMY  11/05/2015   Procedure: POLYPECTOMY;  Surgeon: Lucilla Lame, MD;  Location: [place];  Service: Endoscopy;;  . POLYPECTOMY  03/12/2020   Procedure: POLYPECTOMY;  Surgeon: Lucilla Lame, MD;  Location: [place];  Service: Endoscopy;;  . PROSTATECTOMY      Prior to Admission medications   Medication Sig Start Date End Date Taking? Authorizing Provider  ADVAIR HFA 115-21 MCG/ACT inhaler INL 2 PFS PO BID. RINSE MOUTH AFTER U 09/20/15   [provider]  benzonatate (TESSALON) 200 MG capsule Take one cap TID PRN cough Patient not taking: Reported on 03/01/2020 09/05/17   Lorin Picket, PA-C  Cholecalciferol (VITAMIN D PO) Take by mouth daily.    [provider]  Famotidine (PEPCID PO) Take by mouth as needed.    [provider]  fluticasone (FLONASE) 50 MCG/ACT nasal spray Place into both nostrils daily.    [provider]  Leuprolide Acetate (LUPRON IJ) Inject as directed.    [provider]  levofloxacin (LEVAQUIN) 500 MG tablet Take 1 tablet (500 mg total) daily by mouth. Patient not taking: Reported on 03/01/2020 09/05/17   Lorin Picket, PA-C  Multiple Vitamin (MULTIVITAMIN) tablet Take 1 tablet by mouth daily.    [provider]  Probiotic Product (PROBIOTIC PO) Take by mouth daily.    [provider]    Allergies Aspirin, Codeine, Other, and Penicillins  No family history on file.  Social History Social History   Tobacco Use  . Smoking status: Never Smoker  . Smokeless tobacco: Never Used  Substance Use Topics  . Alcohol use: Yes    Alcohol/week: 7.0 standard drinks    Types: 7 Glasses of wine per week  . Drug use: No     Review of Systems  Constitutional: No fever/chills. Patient has have did not lose consciousness Eyes: No visual changes. No discharge ENT: No upper  respiratory complaints. Cardiovascular: no chest pain. Respiratory: no cough. No SOB. Gastrointestinal: No abdominal pain.  No nausea, no vomiting.  No diarrhea.  No constipation. Musculoskeletal: Right rib pain. Right arm coolness Skin: Negative for rash, abrasions, lacerations, ecchymosis. Neurological: Negative for headaches, focal weakness or numbness.  10 System ROS otherwise negative.  ____________________________________________   PHYSICAL EXAM:  VITAL SIGNS: ED Triage Vitals  Enc Vitals Group     BP 12/09/20 2000 123/87     Pulse Rate 12/09/20 2000 69     Resp 12/09/20 2000 18     Temp 12/09/20 2000 98.2 F (36.8 C)     Temp Source 12/09/20 2000 Oral     SpO2 12/09/20 2000 97 %     Weight 12/09/20 2001 250 lb (113.4 kg)     Height 12/09/20 2001 6\' 2"  (1.88 m)     Head Circumference --      Peak Flow --      Pain Score 12/09/20 2001 7     Pain Loc --      Pain Edu? --      Excl. in [place]? --      Constitutional: Alert and oriented. Well appearing and in no acute distress. Eyes: Conjunctivae are normal. PERRL. EOMI. Head: Atraumatic. No visible signs of trauma. No ecchymosis, rashes, lacerations. No significant tenderness or palpable abnormality. No crepitus. No battle signs, raccoon eyes, serosanguineous or drainage from ears or nares ENT:      Ears:       Nose: No congestion/rhinnorhea.      Mouth/Throat: Mucous membranes are moist.  Neck: No stridor.  No cervical spine tenderness to palpation.  Cardiovascular: Normal rate, regular rhythm. Normal S1 and S2.  Good peripheral circulation. Respiratory: Normal respiratory effort without tachypnea or retractions. Lungs CTAB. Good air entry to the bases with no decreased or absent breath sounds. Musculoskeletal: Full range of motion to all extremities. No gross deformities appreciated. Visualization of the ribs revealed no visible signs of trauma. No abrasions, lacerations, ecchymosis. No paradoxical chest wall movement.  Patient is tender along the right anterolateral ribs diffusely over ribs 5 through 10. No palpable abnormality or crepitus. No subcutaneous emphysema. Good underlying breath sounds bilaterally. Examination of the right upper extremity reveals some coolness to the touch with slight duskiness of the right hand when compared to left. Full range of motion to the right upper extremity. There is no tenderness throughout the osseous structures of the right upper extremity. Radial pulses are intact. Capillary refill is less than 3 seconds but slightly delayed when compared with left. Neurologic:  Normal speech and language. No gross focal neurologic deficits are appreciated.  Skin:  Skin is warm, dry and intact. No rash noted. Psychiatric: Mood and affect are normal. Speech and behavior are normal. Patient exhibits appropriate insight and judgement.   ____________________________________________  LABS (all labs ordered are listed, but only abnormal results are displayed)  Labs Reviewed  COMPREHENSIVE METABOLIC PANEL - Abnormal; Notable for the following components:      Result Value   Glucose, Bld 126 (*)    Calcium 8.6 (*)    All other components within normal limits  CBC WITH DIFFERENTIAL/PLATELET   ____________________________________________  EKG   ____________________________________________  RADIOLOGY I personally viewed and evaluated these images as part of my medical decision making, as well as reviewing the written report by the radiologist.  ED Provider Interpretation: Rib fractures identified on chest x-ray. Hazy density also identified in the right upper lobe. No acute findings on CT head or neck.  DG Ribs Unilateral W/Chest Right  Result Date: 12/09/2020 CLINICAL DATA:  Right anterior rib pain after a fall. Abnormal right upper lung on previous cervical CT. EXAM: RIGHT RIBS AND CHEST - 3+ VIEW COMPARISON:  CT earlier same day. Previous chest radiography 09/13/2017. FINDINGS:  Heart size is normal. Tortuous aorta. The left chest is clear. Hazy density in the right upper lobe could be pneumonia or possibly a mass lesion. Chest CT recommended when able. Right rib films show probable nondisplaced fractures of the anterior sixth and seventh ribs. No displaced fracture. IMPRESSION: 1. Hazy density in the right upper lobe could be pneumonia or possibly a mass lesion. Chest CT recommended when able. 2. Probable nondisplaced fractures of the anterior right sixth and seventh ribs. No displaced rib fracture seen. Electronically Signed   By: Nelson Chimes M.D.   On: 12/09/2020 21:31   CT Head Wo Contrast  Result Date: 12/09/2020 CLINICAL DATA:  [place] today with trauma to the head and neck. EXAM: CT HEAD WITHOUT CONTRAST CT CERVICAL SPINE WITHOUT CONTRAST TECHNIQUE: Multidetector CT imaging of the head and cervical spine was performed following the standard protocol without intravenous contrast. Multiplanar CT image reconstructions of the cervical spine were also generated. COMPARISON:  None. FINDINGS: CT HEAD FINDINGS Brain: Mild age related volume loss. No sign of old or acute focal infarction, mass lesion, hemorrhage, hydrocephalus or extra-axial collection. Vascular: There is atherosclerotic calcification of the major vessels at the base of the brain. Skull: No skull fracture. Sinuses/Orbits: Changes of chronic sinusitis with complete opacification of the paranasal sinuses with exception that there is some airspace in the right maxillary sinus. Widespread mucoperiosteal thickening. Orbits negative. Other: None CT CERVICAL SPINE FINDINGS Alignment: No traumatic malalignment. Skull base and vertebrae: No regional fracture or focal bone lesion. Soft tissues and spinal canal: No traumatic soft tissue finding. Disc levels: Mild degenerative spondylosis C5-6 and C6-7 with bony foraminal encroachment on both sides at those levels. Other levels appear normal for age. Upper chest: Patchy density in the  right upper lobe evident on the very lowest images. This is nonspecific and could represent infiltrate, scarring or mass. Recommend correlation at least with chest radiography. Other: None IMPRESSION: HEAD CT: 1. No acute or traumatic finding. Mild age related volume loss. 2. Chronic pansinusitis. CERVICAL SPINE CT: 1. No acute or traumatic finding. Mild degenerative spondylosis C5-6 and C6-7. 2. Patchy density in the right upper lobe evident on the very lowest images. This is nonspecific and could represent infiltrate, scarring or mass. Recommend correlation at least with chest radiography. Electronically Signed   By: Nelson Chimes M.D.   On: 12/09/2020 20:47   CT Cervical Spine Wo Contrast  Result Date: 12/09/2020 CLINICAL DATA:  [place] today with trauma to the head and neck. EXAM: CT HEAD WITHOUT  CONTRAST CT CERVICAL SPINE WITHOUT CONTRAST TECHNIQUE: Multidetector CT imaging of the head and cervical spine was performed following the standard protocol without intravenous contrast. Multiplanar CT image reconstructions of the cervical spine were also generated. COMPARISON:  None. FINDINGS: CT HEAD FINDINGS Brain: Mild age related volume loss. No sign of old or acute focal infarction, mass lesion, hemorrhage, hydrocephalus or extra-axial collection. Vascular: There is atherosclerotic calcification of the major vessels at the base of the brain. Skull: No skull fracture. Sinuses/Orbits: Changes of chronic sinusitis with complete opacification of the paranasal sinuses with exception that there is some airspace in the right maxillary sinus. Widespread mucoperiosteal thickening. Orbits negative. Other: None CT CERVICAL SPINE FINDINGS Alignment: No traumatic malalignment. Skull base and vertebrae: No regional fracture or focal bone lesion. Soft tissues and spinal canal: No traumatic soft tissue finding. Disc levels: Mild degenerative spondylosis C5-6 and C6-7 with bony foraminal encroachment on both sides at those levels.  Other levels appear normal for age. Upper chest: Patchy density in the right upper lobe evident on the very lowest images. This is nonspecific and could represent infiltrate, scarring or mass. Recommend correlation at least with chest radiography. Other: None IMPRESSION: HEAD CT: 1. No acute or traumatic finding. Mild age related volume loss. 2. Chronic pansinusitis. CERVICAL SPINE CT: 1. No acute or traumatic finding. Mild degenerative spondylosis C5-6 and C6-7. 2. Patchy density in the right upper lobe evident on the very lowest images. This is nonspecific and could represent infiltrate, scarring or mass. Recommend correlation at least with chest radiography. Electronically Signed   By: Nelson Chimes M.D.   On: 12/09/2020 20:47    ____________________________________________    PROCEDURES  Procedure(s) performed:    Procedures    Medications  iohexol (OMNIPAQUE) 350 MG/ML injection 100 mL (has no administration in time range)     ____________________________________________   INITIAL IMPRESSION / ASSESSMENT AND PLAN / ED COURSE  Pertinent labs & imaging results that were available during my care of the patient were reviewed by me and considered in my medical decision making (see chart for details).  Review of the [place] CSRS was performed in accordance of the [place] prior to dispensing any controlled drugs.           Patient presented to emergency department after mechanical fall while playing basketball. Primary complaint on arrival was rib pain. Patient did hit his head but did not lose consciousness. While here patient noted that his right arm was cool both feeling cold as well as cool to the touch. There was some slight duskiness to the right upper extremity when compared with left as well as some very mild delayed capillary refill when compared with left. Imaging of the head and neck was reassuring. Slight haziness in the right upper lobe and rib fractures were identified on rib  series. At this time I have imaged the patient was CT scan of the chest and right upper extremity. Results are still pending and at this time will handover patient care to attending provider, Dr. Charna Archer for final diagnosis and disposition.   This chart was dictated using voice recognition software/Dragon. Despite best efforts to proofread, errors can occur which can change the meaning. Any change was purely unintentional.    Darletta Moll, PA-C 12/09/20 2238    Blake Divine, MD 12/10/20 1515

## 2020-12-09 NOTE — ED Provider Notes (Signed)
-----------------------------------------   10:33 PM on 12/09/2020 -----------------------------------------  Blood pressure 126/72, pulse 66, temperature 98.2 F (36.8 C), temperature source Oral, resp. rate 20, height 6\' 2"  (1.88 m), weight 113.4 kg, SpO2 98 %.  Assuming care from [name].  In short, Alejandro Jordan is a 75 y.o. male with a chief complaint of Rib Injury (Accidental fall , Complaining of right rib pain . ) .  Refer to the original H&P for additional details.  The current plan of care is to follow-up CT imaging of chest for possible lesion as well as concern for vascular injury to right upper extremity.  ----------------------------------------- 11:35 PM on 12/09/2020 -----------------------------------------  CTA of right upper extremity shows no evidence of radial artery occlusion, but unfortunately view of ulnar artery is limited.  Low suspicion for arterial injury at this time as patient states his right upper extremity feels much warmer, he is able to move it without difficulty, and cap refill is less than 2 seconds.  CT chest shows right-sided rib fracture as well as right upper lobe pneumonia.  We will treat with doxycycline and prescribed pain medication.  Patient was additionally informed of pulmonary nodules and thoracic aortic aneurysm, will follow up with his PCP for these findings.  He was counseled to return to the ED for new worsening symptoms.  Patient agrees with plan.    Blake Divine, MD 12/09/20 [phone]

## 2020-12-09 NOTE — ED Triage Notes (Signed)
Pt from home. With a C/C; Right Rib pain from an accidental fall earlier this evening. States' Hurts when he deep breaths . Pt has good equal chest rise.  Stated also hit the front of his head ( no visual deformities noted , Normal Neuro & Respiratory , denies losing consciousness

## 2020-12-10 NOTE — ED Notes (Signed)
Insentive spirometer was given to pt and pt was able to teach nurse on how to use device

## 2021-02-07 ENCOUNTER — Encounter: Payer: Self-pay | Admitting: Emergency Medicine

## 2021-02-07 ENCOUNTER — Ambulatory Visit (INDEPENDENT_AMBULATORY_CARE_PROVIDER_SITE_OTHER): Payer: Medicare Other

## 2021-02-07 ENCOUNTER — Ambulatory Visit
Admission: EM | Admit: 2021-02-07 | Discharge: 2021-02-07 | Disposition: A | Discharge status: Home / Self Care | Payer: Medicare Other | Attending: Physician Assistant | Admitting: Physician Assistant

## 2021-02-07 ENCOUNTER — Other Ambulatory Visit: Payer: Self-pay

## 2021-02-07 DIAGNOSIS — R059 Cough, unspecified: Secondary | ICD-10-CM | POA: Diagnosis not present

## 2021-02-07 DIAGNOSIS — J209 Acute bronchitis, unspecified: Secondary | ICD-10-CM | POA: Diagnosis not present

## 2021-02-07 DIAGNOSIS — R042 Hemoptysis: Secondary | ICD-10-CM | POA: Diagnosis not present

## 2021-02-07 DIAGNOSIS — J329 Chronic sinusitis, unspecified: Secondary | ICD-10-CM | POA: Diagnosis not present

## 2021-02-07 MED ORDER — DOXYCYCLINE HYCLATE 100 MG PO CAPS: 100.0000 mg | ORAL_CAPSULE | Freq: Two times a day (BID) | ORAL | 0 refills | Status: AC

## 2021-02-07 NOTE — ED Triage Notes (Signed)
Patient c/o cough that he has had x 7 days. He states today he coughed up dark brown/blood tinged mucous today.

## 2021-02-07 NOTE — ED Provider Notes (Signed)
[facility]    CSN: [id number] Arrival date & time: 02/07/21  1635      History   Chief Complaint Chief Complaint  Patient presents with  . Cough    HPI Alejandro Jordan is a 75 y.o. male presenting for approximately 1 week history of cough that is productive.  Patient states that since earlier today he has had productive cough that is blood-tinged.  He says that he is coughed 3 different times and had streaks of blood.  The blood is dark brownish.  Patient has history of chronic sinusitis and supposed to have an upcoming surgery in 3 weeks.  Patient concerned because he says he wants to make sure he is over this illness before he has his surgery.  He also has history of asthma.  He is denying any fevers, chest pain or breathing difficulty.  Has not needed to use his asthma inhalers more recently.  Patient says that he has chronic nasal congestion and sinus pain and uses Flonase and oral decongestants.  He denies any recent COVID exposure.  No other complaints or concerns.  HPI  Past Medical History:  Diagnosis Date  . Asthma   . Dental restoration present    implant - bottom front  . Dysrhythmia    irregular beat - occasional - followed by PCP  . Prostate cancer ([place])   . Sciatica    right    Patient Active Problem List   Diagnosis Date Noted  . Polyp of transverse colon   . Personal history of colonic polyps   . Benign neoplasm of cecum   . Benign neoplasm of descending colon     Past Surgical History:  Procedure Laterality Date  . APPENDECTOMY    . CHOLECYSTECTOMY    . COLONOSCOPY     multiple  . COLONOSCOPY WITH PROPOFOL N/A 11/05/2015   Procedure: COLONOSCOPY WITH PROPOFOL;  Surgeon: Lucilla Lame, MD;  Location: [place];  Service: Endoscopy;  Laterality: N/A;  KEEP PT LAST   . COLONOSCOPY WITH PROPOFOL N/A 03/12/2020   Procedure: COLONOSCOPY WITH PROPOFOL;  Surgeon: Lucilla Lame, MD;  Location: [place];  Service: Endoscopy;   Laterality: N/A;  priority 3  . HERNIA REPAIR Right    as child  . NOSE SURGERY    . POLYPECTOMY  11/05/2015   Procedure: POLYPECTOMY;  Surgeon: Lucilla Lame, MD;  Location: [place];  Service: Endoscopy;;  . POLYPECTOMY  03/12/2020   Procedure: POLYPECTOMY;  Surgeon: Lucilla Lame, MD;  Location: [place];  Service: Endoscopy;;  . PROSTATECTOMY         Home Medications    Prior to Admission medications   Medication Sig Start Date End Date Taking? Authorizing Provider  ADVAIR HFA 115-21 MCG/ACT inhaler INL 2 PFS PO BID. RINSE MOUTH AFTER U 09/20/15  Yes [provider]  Cholecalciferol (VITAMIN D PO) Take by mouth daily.   Yes [provider]  doxycycline (VIBRAMYCIN) 100 MG capsule Take 1 capsule (100 mg total) by mouth 2 (two) times daily for 7 days. 02/07/21 02/14/21 Yes Laurene Footman B, PA-C  Famotidine (PEPCID PO) Take by mouth as needed.   Yes [provider]  fluticasone (FLONASE) 50 MCG/ACT nasal spray Place into both nostrils daily.   Yes [provider]  Multiple Vitamin (MULTIVITAMIN) tablet Take 1 tablet by mouth daily.   Yes [provider]  benzonatate (TESSALON) 200 MG capsule Take one cap TID PRN cough Patient not taking: Reported  on 03/01/2020 09/05/17   Lorin Picket, PA-C  Leuprolide Acetate (LUPRON IJ) Inject as directed.    [provider]  levofloxacin (LEVAQUIN) 500 MG tablet Take 1 tablet (500 mg total) daily by mouth. Patient not taking: Reported on 03/01/2020 09/05/17   Crecencio Mc P, PA-C  lidocaine (LIDODERM) 5 % Place 1 patch onto the skin every 12 (twelve) hours. Remove & Discard patch within 12 hours or as directed by MD 12/09/20 12/09/21  Blake Divine, MD  oxyCODONE-acetaminophen (PERCOCET) 5-325 MG tablet Take 1 tablet by mouth every 4 (four) hours as needed. 12/09/20 12/09/21  Blake Divine, MD  Probiotic Product (PROBIOTIC PO) Take by mouth daily.    [provider]     Family History History reviewed. No pertinent family history.  Social History Social History   Tobacco Use  . Smoking status: Never Smoker  . Smokeless tobacco: Never Used  Vaping Use  . Vaping Use: Never used  Substance Use Topics  . Alcohol use: Yes    Alcohol/week: 7.0 standard drinks    Types: 7 Glasses of wine per week  . Drug use: No     Allergies   Aspirin, Codeine, Other, and Penicillins   Review of Systems Review of Systems  Constitutional: Negative for fatigue and fever.  HENT: Positive for congestion, rhinorrhea, sinus pressure and sinus pain. Negative for sore throat.   Respiratory: Positive for cough. Negative for shortness of breath and wheezing.   Cardiovascular: Negative for chest pain.  Gastrointestinal: Negative for abdominal pain, diarrhea, nausea and vomiting.  Musculoskeletal: Negative for myalgias.  Neurological: Negative for weakness, light-headedness and headaches.  Hematological: Negative for adenopathy.     Physical Exam Triage Vital Signs ED Triage Vitals  Enc Vitals Group     BP 02/07/21 1650 110/88     Pulse Rate 02/07/21 1650 71     Resp 02/07/21 1650 18     Temp 02/07/21 1650 98.2 F (36.8 C)     Temp Source 02/07/21 1650 Oral     SpO2 02/07/21 1650 97 %     Weight 02/07/21 1647 250 lb (113.4 kg)     Height 02/07/21 1647 6\' 2"  (1.88 m)     Head Circumference --      Peak Flow --      Pain Score 02/07/21 1647 0     Pain Loc --      Pain Edu? --      Excl. in [place]? --    No data found.  Updated Vital Signs BP 110/88 (BP Location: Left Arm)   Pulse 71   Temp 98.2 F (36.8 C) (Oral)   Resp 18   Ht 6\' 2"  (1.88 m)   Wt 250 lb (113.4 kg)   SpO2 97%   BMI 32.10 kg/m    Physical Exam Vitals and nursing note reviewed.  Constitutional:      General: He is not in acute distress.    Appearance: Normal appearance. He is well-developed. He is not ill-appearing or diaphoretic.  HENT:     Head: Normocephalic and  atraumatic.     Right Ear: Tympanic membrane, ear canal and external ear normal.     Left Ear: Tympanic membrane, ear canal and external ear normal.     Nose: Congestion and rhinorrhea present.     Mouth/Throat:     Mouth: Mucous membranes are moist.     Pharynx: Oropharynx is clear. Uvula midline. No oropharyngeal exudate.     Tonsils:  No tonsillar abscesses.  Eyes:     General: No scleral icterus.       Right eye: No discharge.        Left eye: No discharge.     Conjunctiva/sclera: Conjunctivae normal.  Neck:     Thyroid: No thyromegaly.     Trachea: No tracheal deviation.  Cardiovascular:     Rate and Rhythm: Normal rate and regular rhythm.     Heart sounds: Normal heart sounds.  Pulmonary:     Effort: Pulmonary effort is normal. No respiratory distress.     Breath sounds: Wheezing (few scattered wheezes throughout) present. No rhonchi or rales.  Musculoskeletal:     Cervical back: Normal range of motion and neck supple.  Lymphadenopathy:     Cervical: No cervical adenopathy.  Skin:    General: Skin is warm and dry.     Findings: No rash.  Neurological:     General: No focal deficit present.     Mental Status: He is alert. Mental status is at baseline.     Motor: No weakness.     Gait: Gait normal.  Psychiatric:        Mood and Affect: Mood normal.        Behavior: Behavior normal.        Thought Content: Thought content normal.      UC Treatments / Results  Labs (all labs ordered are listed, but only abnormal results are displayed) Labs Reviewed - No data to display  EKG   Radiology DG Chest 2 View  Result Date: 02/07/2021 CLINICAL DATA:  Cough EXAM: CHEST - 2 VIEW COMPARISON:  12/09/2020 FINDINGS: Heart and mediastinal contours are within normal limits. No focal opacities or effusions. No acute bony abnormality. IMPRESSION: No active cardiopulmonary disease. Electronically Signed   By: Rolm Baptise M.D.   On: 02/07/2021 17:14    Procedures Procedures  (including critical care time)  Medications Ordered in UC Medications - No data to display  Initial Impression / Assessment and Plan / UC Course  I have reviewed the triage vital signs and the nursing notes.  Pertinent labs & imaging results that were available during my care of the patient were reviewed by me and considered in my medical decision making (see chart for details).   75 year old male presenting for productive cough and concerns related to blood-tinged sputum.  Has history of chronic sinusitis and asthma.  He is denying any chest pain or shortness of breath.  Vital signs are normal and stable.  He is overall well-appearing.  He does have nasal congestion and rhinorrhea.  Few scattered wheezes throughout.  Heart regular rate and rhythm.  Chest x-ray obtained today and independently viewed by me.  Chest x-ray does not show any active or acute cardiopulmonary disease.  Reviewed this with patient.  Since patient has a history of chronic sinusitis and asthma and has an upcoming surgery, did offer him doxycycline for acute bronchitis.  I did advise him that he likely has a viral infection, but if still not feeling better in a few days he can start the doxycycline.  Encouraged use of Mucinex increasing rest and fluids.  Advised him against forceful coughing.  Has Robitussin at night if needed.  Advised him to go to the ED if he has any excessive blood in the sputum or he is only coughing up blood and blood clots.  Or, advised to go to ED for any fevers, chest pain or breathing problem.  Final Clinical Impressions(s) / UC Diagnoses   Final diagnoses:  Acute bronchitis, unspecified organism  Cough  Chronic sinusitis, unspecified location  Hemoptysis     Discharge Instructions     Your chest x-ray looks good.  No evidence of pneumonia.  Your vital signs are all reassuring.  You do not have a fever and your oxygen is normal.  There are a couple of wheezes and I listen to you so I  would just use your albuterol inhaler if you are feeling short of breath.  Since you do have a history of chronic sinusitis and asthma and are going to have an upcoming surgery, I sent an antibiotic for you.  Although, you likely have a chest cold and is getting take a little time to get better.  Increase your rest and fluid intake and start taking Mucinex.  Follow-up as needed for any worsening cough, fever breathing difficulty or if you notice that you are coughing up excessive blood.    ED Prescriptions    Medication Sig Dispense Auth. Provider   doxycycline (VIBRAMYCIN) 100 MG capsule Take 1 capsule (100 mg total) by mouth 2 (two) times daily for 7 days. 14 capsule Danton Clap, PA-C     PDMP not reviewed this encounter.   Danton Clap, PA-C 02/07/21 1806

## 2021-02-07 NOTE — Discharge Instructions (Signed)
Your chest x-ray looks good.  No evidence of pneumonia.  Your vital signs are all reassuring.  You do not have a fever and your oxygen is normal.  There are a couple of wheezes and I listen to you so I would just use your albuterol inhaler if you are feeling short of breath.  Since you do have a history of chronic sinusitis and asthma and are going to have an upcoming surgery, I sent an antibiotic for you.  Although, you likely have a chest cold and is getting take a little time to get better.  Increase your rest and fluid intake and start taking Mucinex.  Follow-up as needed for any worsening cough, fever breathing difficulty or if you notice that you are coughing up excessive blood.

## 2021-12-19 ENCOUNTER — Other Ambulatory Visit: Payer: Self-pay | Admitting: Family Medicine

## 2021-12-19 DIAGNOSIS — I712 Thoracic aortic aneurysm, without rupture, unspecified: Secondary | ICD-10-CM

## 2022-01-06 ENCOUNTER — Other Ambulatory Visit: Payer: Self-pay

## 2022-01-06 ENCOUNTER — Ambulatory Visit
Admission: RE | Admit: 2022-01-06 | Discharge: 2022-01-06 | Disposition: A | Discharge status: Home / Self Care | Payer: Medicare Other | Source: Ambulatory Visit | Attending: Family Medicine | Admitting: Family Medicine

## 2022-01-06 DIAGNOSIS — I712 Thoracic aortic aneurysm, without rupture, unspecified: Secondary | ICD-10-CM | POA: Diagnosis present

## 2022-01-06 LAB — POCT I-STAT CREATININE: Creatinine, Ser: 0.7 mg/dL (ref 0.61–1.24)

## 2022-01-06 MED ORDER — IOHEXOL 350 MG/ML SOLN
75.0000 mL | Freq: Once | INTRAVENOUS | Status: AC | PRN
  Administered 2022-01-06: 75 mL via INTRAVENOUS

## 2022-02-13 ENCOUNTER — Other Ambulatory Visit: Payer: Self-pay | Admitting: Family Medicine

## 2022-02-13 DIAGNOSIS — Z87891 Personal history of nicotine dependence: Secondary | ICD-10-CM

## 2022-02-13 DIAGNOSIS — Z8249 Family history of ischemic heart disease and other diseases of the circulatory system: Secondary | ICD-10-CM

## 2022-02-14 ENCOUNTER — Other Ambulatory Visit: Payer: Self-pay

## 2022-02-14 ENCOUNTER — Ambulatory Visit
Admission: RE | Admit: 2022-02-14 | Discharge: 2022-02-14 | Disposition: A | Discharge status: Home / Self Care | Payer: Medicare Other | Source: Ambulatory Visit | Attending: Family Medicine | Admitting: Family Medicine

## 2022-02-14 DIAGNOSIS — Z8249 Family history of ischemic heart disease and other diseases of the circulatory system: Secondary | ICD-10-CM

## 2022-02-14 DIAGNOSIS — Z87891 Personal history of nicotine dependence: Secondary | ICD-10-CM | POA: Diagnosis present

## 2022-03-11 ENCOUNTER — Telehealth: Payer: Self-pay

## 2022-03-11 NOTE — Telephone Encounter (Signed)
Returned pts call shortly after message was received to call him back but he did not answer.   Thanks, Shannon, [place]

## 2023-05-16 ENCOUNTER — Ambulatory Visit
Admission: EM | Admit: 2023-05-16 | Discharge: 2023-05-16 | Disposition: A | Discharge status: Home / Self Care | Payer: Medicare Other | Source: Home / Self Care

## 2023-05-16 ENCOUNTER — Encounter: Payer: Self-pay | Admitting: Emergency Medicine

## 2023-05-16 DIAGNOSIS — Z23 Encounter for immunization: Secondary | ICD-10-CM | POA: Diagnosis not present

## 2023-05-16 DIAGNOSIS — S51812A Laceration without foreign body of left forearm, initial encounter: Secondary | ICD-10-CM

## 2023-05-16 HISTORY — DX: Unspecified atrial fibrillation: I48.91

## 2023-05-16 MED ORDER — TETANUS-DIPHTH-ACELL PERTUSSIS 5-2.5-18.5 LF-MCG/0.5 IM SUSY
0.5000 mL | PREFILLED_SYRINGE | Freq: Once | INTRAMUSCULAR | Status: AC
  Administered 2023-05-16: 0.5 mL via INTRAMUSCULAR

## 2023-05-16 MED ORDER — DOXYCYCLINE HYCLATE 100 MG PO CAPS: 100.0000 mg | ORAL_CAPSULE | Freq: Two times a day (BID) | ORAL | 0 refills | Status: AC

## 2023-05-16 NOTE — ED Provider Notes (Signed)
[facility]    CSN: [id number] Arrival date & time: 05/16/23  0841      History   Chief Complaint Chief Complaint  Patient presents with   Wound Check    HPI Alejandro Jordan is a 77 y.o. male.   HPI  77 year old male with past medical history significant for prostate cancer with prostatectomy, dysrhythmia, asthma, sciatica on Eliquis presents for evaluation of a small cut on his left forearm.  He states he is packing up his house as he is in his wife are moving to [place].  He noticed a small cut 3 days on the left forearm and now he has noticed some bruising and he is concerned.  No pain, drainage, or fever.  Patient's last tetanus shot was in 2013.  Past Medical History:  Diagnosis Date   Asthma    Atrial fibrillation ([facility])    Dental restoration present    implant - bottom front   Dysrhythmia    irregular beat - occasional - followed by PCP   Prostate cancer [facility])    Sciatica    right    Patient Active Problem List   Diagnosis Date Noted   Polyp of transverse colon    Personal history of colonic polyps    Benign neoplasm of cecum    Benign neoplasm of descending colon     Past Surgical History:  Procedure Laterality Date   APPENDECTOMY     CHOLECYSTECTOMY     COLONOSCOPY     multiple   COLONOSCOPY WITH PROPOFOL N/A 11/05/2015   Procedure: COLONOSCOPY WITH PROPOFOL;  Surgeon: Midge Minium, MD;  Location[facility];  Service: Endoscopy;  Laterality: N/A;  KEEP PT LAST    COLONOSCOPY WITH PROPOFOL N/A 03/12/2020   Procedure: COLONOSCOPY WITH PROPOFOL;  Surgeon: Midge Minium, MD;  Location: [facility];  Service: Endoscopy;  Laterality: N/A;  priority 3   HERNIA REPAIR Right    as child   NOSE SURGERY     POLYPECTOMY  11/05/2015   Procedure: POLYPECTOMY;  Surgeon: Midge Minium, MD;  Location: [facility];  Service: Endoscopy;;   POLYPECTOMY  03/12/2020   Procedure: POLYPECTOMY;  Surgeon: Midge Minium, MD;  Location: [facility];  Service: Endoscopy;;   PROSTATECTOMY         Home Medications    Prior to Admission medications   Medication Sig Start Date End Date Taking? Authorizing Provider  Cholecalciferol (VITAMIN D PO) Take by mouth daily.   Yes [provider]  doxycycline (VIBRAMYCIN) 100 MG capsule Take 1 capsule (100 mg total) by mouth 2 (two) times daily for 5 days. 05/16/23 05/21/23 Yes Becky Augusta, NP  Famotidine (PEPCID PO) Take by mouth as needed.   Yes [provider]  fluticasone (FLONASE) 50 MCG/ACT nasal spray Place into both nostrils daily.   Yes [provider]  fluticasone (FLOVENT HFA) 110 MCG/ACT inhaler Inhale into the lungs. 07/24/22 07/19/23 Yes [provider]  Multiple Vitamin (MULTIVITAMIN) tablet Take 1 tablet by mouth daily.   Yes [provider]  ADVAIR HFA 115-21 MCG/ACT inhaler INL 2 PFS PO BID. RINSE MOUTH AFTER U 09/20/15   [provider]  albuterol (PROAIR HFA) 108 (90 Base) MCG/ACT inhaler INL 2 PFS QID PRN    [provider]  ELIQUIS 5 MG TABS tablet Take 5 mg by mouth 2 (two) times daily.    [provider]  Leuprolide Acetate (LUPRON IJ) Inject as directed.  [provider]  magnesium oxide (MAG-OX) 400 MG tablet Take by mouth.    [provider]  Probiotic Product (PROBIOTIC PO) Take by mouth daily.    [provider]    Family History History reviewed. No pertinent family history.  Social History Social History   Tobacco Use   Smoking status: Never   Smokeless tobacco: Never  Vaping Use   Vaping status: Never Used  Substance Use Topics   Alcohol use: Yes    Alcohol/week: 7.0 standard drinks of alcohol    Types: 7 Glasses of wine per week   Drug use: No     Allergies   Aspirin, Codeine, Other, and Penicillins   Review of Systems Review of Systems  Constitutional:  Negative for fever.  Skin:  Positive for color change and wound.     Physical  Exam Triage Vital Signs ED Triage Vitals  Encounter Vitals Group     BP      Systolic BP Percentile      Diastolic BP Percentile      Pulse      Resp      Temp      Temp src      SpO2      Weight      Height      Head Circumference      Peak Flow      Pain Score      Pain Loc      Pain Education      Exclude from Growth Chart    No data found.  Updated Vital Signs BP 127/79 (BP Location: Left Arm)   Pulse (!) 54   Temp 98.3 F (36.8 C) (Oral)   Resp 18   Ht 6\' 2"  (1.88 m)   Wt 250 lb (113.4 kg)   SpO2 100%   BMI 32.10 kg/m   Visual Acuity Right Eye Distance:   Left Eye Distance:   Bilateral Distance:    Right Eye Near:   Left Eye Near:    Bilateral Near:     Physical Exam Vitals and nursing note reviewed.  Constitutional:      Appearance: Normal appearance. He is not ill-appearing.  HENT:     Head: Normocephalic and atraumatic.  Skin:    General: Skin is warm and dry.     Capillary Refill: Capillary refill takes less than 2 seconds.     Findings: Bruising present.  Neurological:     General: No focal deficit present.     Mental Status: He is alert and oriented to person, place, and time.      UC Treatments / Results  Labs (all labs ordered are listed, but only abnormal results are displayed) Labs Reviewed - No data to display  EKG   Radiology No results found.  Procedures Procedures (including critical care time)  Medications Ordered in UC Medications  Tdap (BOOSTRIX) injection 0.5 mL (0.5 mLs Intramuscular Given 05/16/23 0915)    Initial Impression / Assessment and Plan / UC Course  I have reviewed the triage vital signs and the nursing notes.  Pertinent labs & imaging results that were available during my care of the patient were reviewed by me and considered in my medical decision making (see chart for details).   Patient is a nontoxic-appearing 77 year old male presenting for evaluation of a small laceration to his left forearm  as outlined HPI above.  As she continues above, the laceration is very superficial with some surrounding  ecchymosis.  There is no pus drainage or heat noted at the wound.  I believe the bruising is secondary to the patient being on Eliquis.  I will start him on doxycycline 100 mg twice daily for 5 days as prophylaxis given that it is an open laceration and we are unsure what he sustained a laceration from.  We will also update the patient's tetanus shot.  Infection control and return precautions reviewed.  Final Clinical Impressions(s) / UC Diagnoses   Final diagnoses:  Laceration of left forearm, initial encounter     Discharge Instructions      Clean the wound daily and apply bacitracin for the first 2 days.  After that a scab should have started to form and you can leave the wound open to air when you are at home and cover with a Band-Aid when you go out in public.  Take the Doxycycline twice daily for 5 days for prevention of wound infection.  If you develop any redness at the wound site, swelling, pain, drainage, red streaks going up your hand, or fever please return for reevaluation.      ED Prescriptions     Medication Sig Dispense Auth. Provider   doxycycline (VIBRAMYCIN) 100 MG capsule Take 1 capsule (100 mg total) by mouth 2 (two) times daily for 5 days. 10 capsule Becky Augusta, NP      PDMP not reviewed this encounter.   Becky Augusta, NP 05/16/23 [phone]

## 2023-05-16 NOTE — ED Triage Notes (Addendum)
Pt has a small cut on his left forearm. He states there is bruising around the area. He is unsure what he could have done to the area.  Pt last tetanus was 2013.

## 2023-05-16 NOTE — Discharge Instructions (Signed)
Clean the wound daily and apply bacitracin for the first 2 days.  After that a scab should have started to form and you can leave the wound open to air when you are at home and cover with a Band-Aid when you go out in public.  Take the Doxycycline twice daily for 5 days for prevention of wound infection.  If you develop any redness at the wound site, swelling, pain, drainage, red streaks going up your hand, or fever please return for reevaluation.

## 2024-02-25 IMAGING — CT CT ANGIO CHEST
3 of 6 series · 17 of 46 positions shown · IV contrast (APPLIED)
Comparison: 05/08/2021

CLINICAL DATA: Follow-up thoracic aortic aneurysm without rupture.

EXAM:
CT ANGIOGRAPHY CHEST WITH CONTRAST
TECHNIQUE: Multidetector CT imaging of the chest was performed using the
standard protocol during bolus administration of intravenous
contrast. Multiplanar CT image reconstructions and MIPs were
obtained to evaluate the vascular anatomy.

[Series 4: axial arterial · axial · arterial · 0.79mm/px · z∈[-396,-111]mm · 11 of 115 slices shown]
[im 10/115  lung]
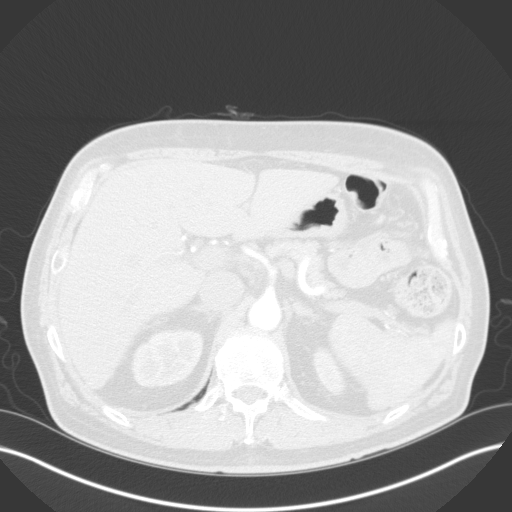
[im 20/115  soft-tissue]
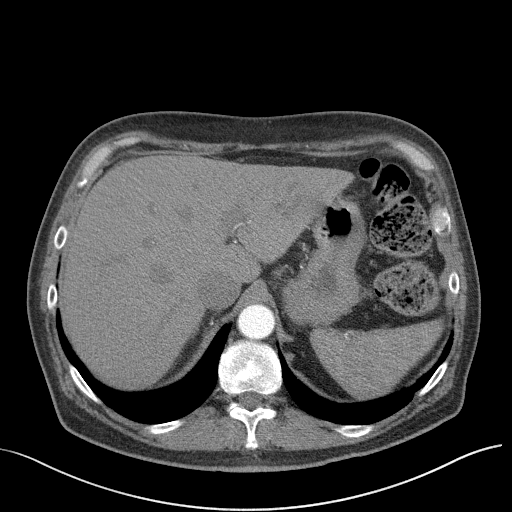
[im 29/115  lung]
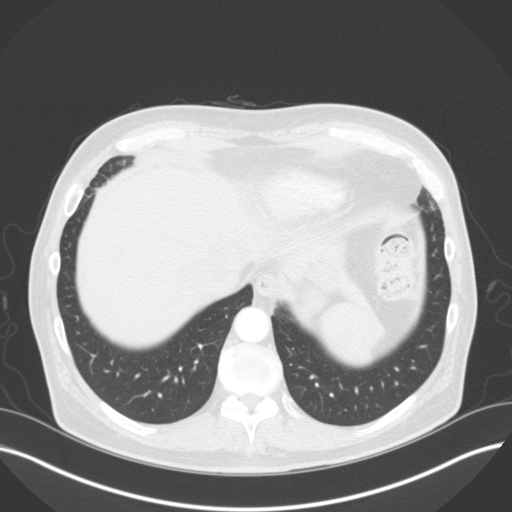
[im 39/115  soft-tissue]
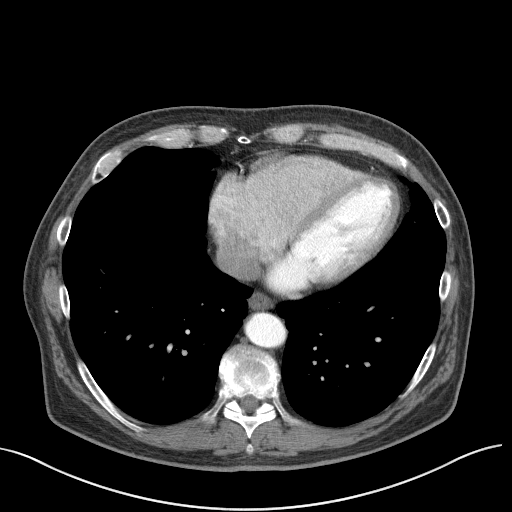
[im 48/115  lung]
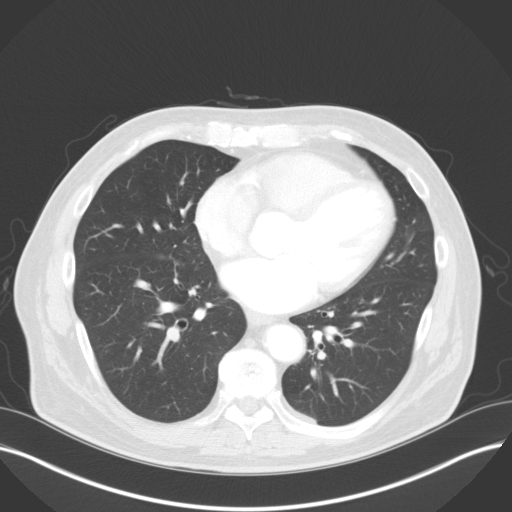
[im 58/115  soft-tissue]
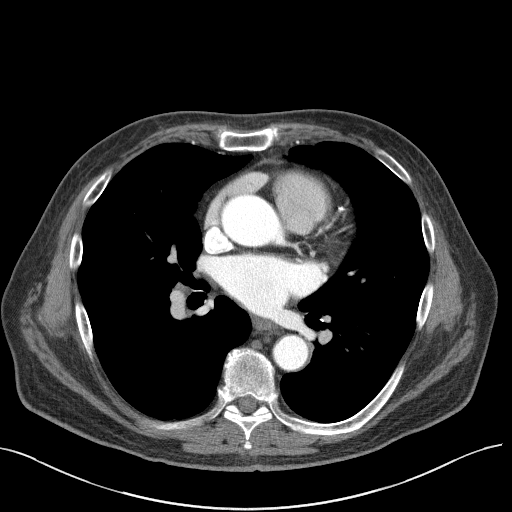
[im 67/115  lung]
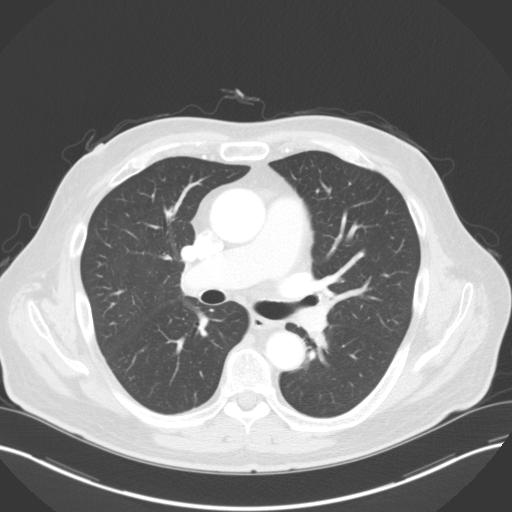
[im 77/115  soft-tissue]
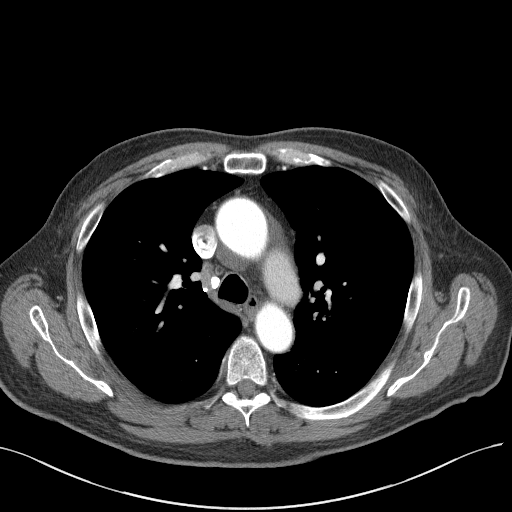
[im 86/115  lung]
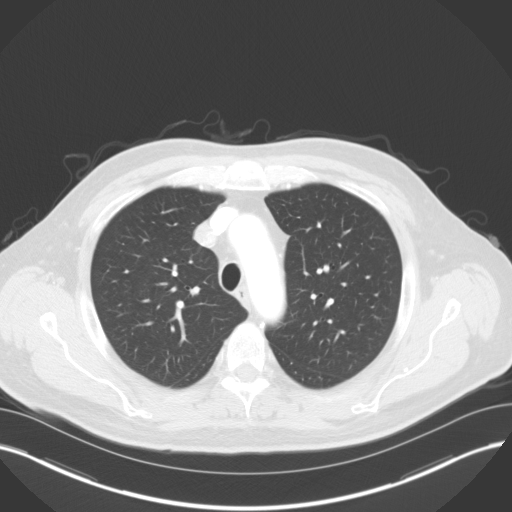
[im 96/115  soft-tissue]
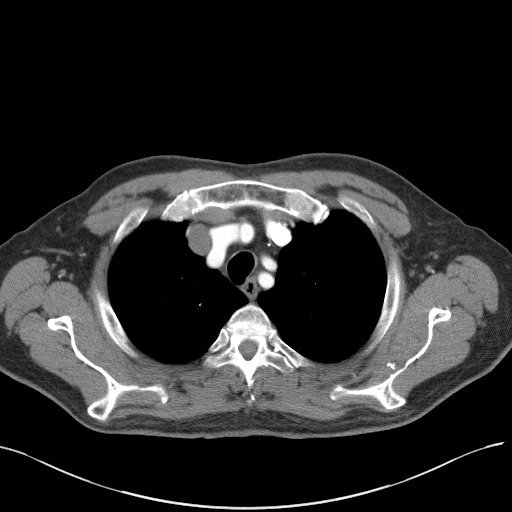
[im 105/115  lung]
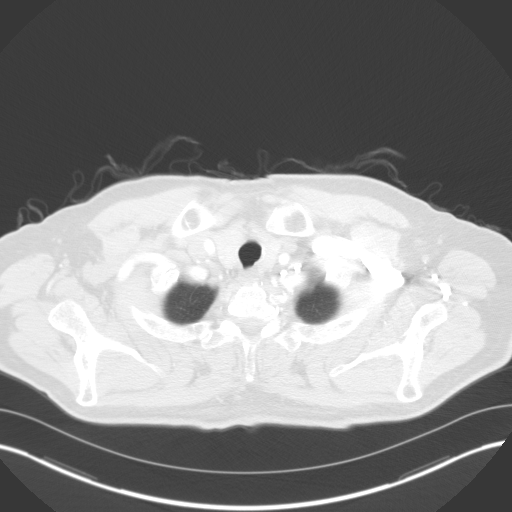

[Series 5: lung · axial · 0.79mm/px · z∈[-387,-311]mm · 3 of 173 slices shown]
[im 20/173  soft-tissue]
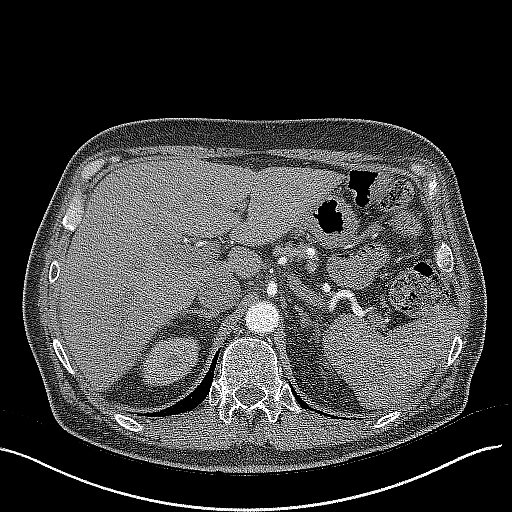
[im 39/173  soft-tissue]
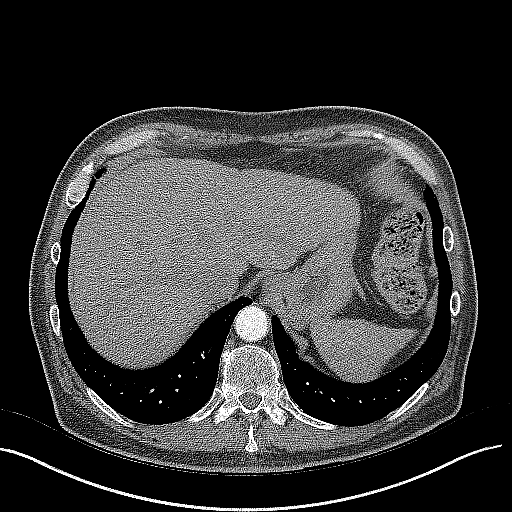
[im 58/173  soft-tissue]
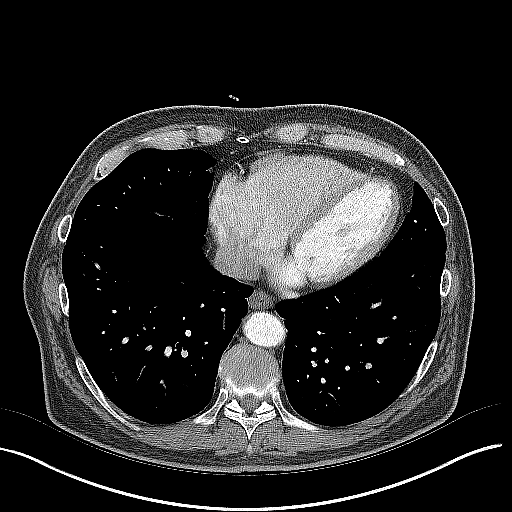

[Series 7: coronal · coronal · 0.73mm/px · 3 of 98 slices shown]
[im 25/98  soft-tissue]
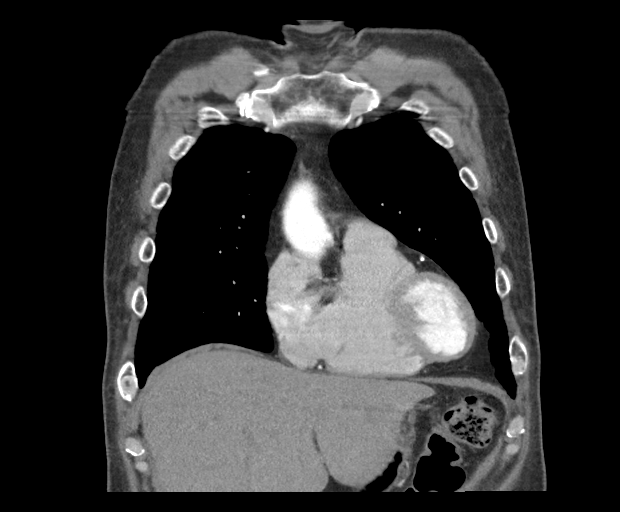
[im 49/98  soft-tissue]
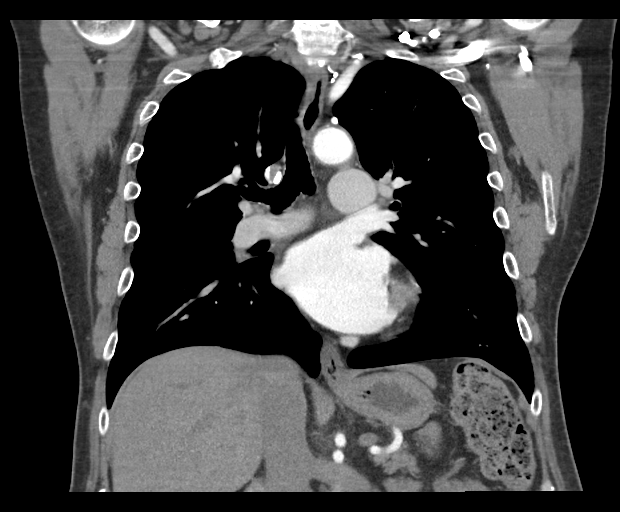
[im 73/98  soft-tissue]
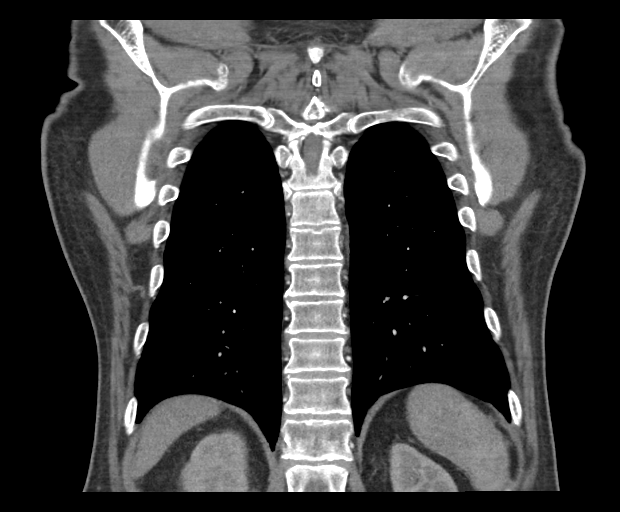

[17 of 46 positions shown; findings below may reference images not displayed]

RADIATION DOSE REDUCTION: This exam was performed according to the
departmental dose-optimization program which includes automated
exposure control, adjustment of the mA and/or kV according to
patient size and/or use of iterative reconstruction technique.

CONTRAST:  75mL OMNIPAQUE IOHEXOL 350 MG/ML SOLN
FINDINGS: Cardiovascular: Aortic root at the sinuses measures 4.1 cm.
Sinotubular junction measures 3.5 cm. Ascending thoracic aorta
measures up to 4.3 cm and stable. Distal ascending thoracic aorta
measures 3.8 cm and stable. Great vessels are patent. Typical
three-vessel arch anatomy. Proximal descending thoracic aorta
measures 2.8 cm and stable. Distal descending thoracic aorta
measures 2.7 cm and stable. Negative for an aortic dissection.
Celiac trunk is widely patent. Proximal SMA is widely patent.
Bilateral renal arteries are patent without significant stenosis.
Heart size is normal. Small amount of fluid in the pericardial
recess.

Mediastinum/Nodes: Thyroid tissue is unremarkable. No significant
mediastinal, hilar or axillary lymph node enlargement. Esophagus is
unremarkable.

Lungs/Pleura: Again noted is dilatation along the posterior aspect
of the trachea. No pleural effusions. Stable triangular shaped
density in the right middle lobe measures roughly 4 mm on sequence 5
image 97. Stable subpleural nodule in the right lower lobe measures
3 mm on image 80. No suspicious pulmonary nodules. Dependent
densities in the lungs could be related to mild areas of scarring or
atelectasis. Additional punctate nodules that have not significantly
changed.

Upper Abdomen: Cholecystectomy.  No acute abnormality.

Musculoskeletal: No acute bone abnormality.

Review of the MIP images confirms the above findings.
IMPRESSION: 1. Stable fusiform aneurysm of the ascending thoracic aorta
measuring 4.3 cm. Recommend annual imaging followup by CTA or MRA.
This recommendation follows 2606
ACCF/AHA/AATS/ACR/ASA/SCA/RODRIGO/JUDE/DENMARK/MANDIE Guidelines for the
Diagnosis and Management of Patients with Thoracic Aortic Disease.
Circulation. 2606; 121: [id number]. Aortic aneurysm NOS ([id number])
2. No acute chest abnormality.
3. Stable small pulmonary nodules.

## 2024-04-04 IMAGING — US US AORTA
1 series · 14 of 23 positions shown · non-contrast
Comparison: None.

CLINICAL DATA: Family history of aortic aneurysm, thoracic aortic
aneurysm seen in the previous CT done on 01/06/2022

EXAM:
ULTRASOUND OF ABDOMINAL AORTA
TECHNIQUE: Ultrasound examination of the abdominal aorta and proximal common
iliac arteries was performed to evaluate for aneurysm. Additional
color and Doppler images of the distal aorta were obtained to
document patency.

[Series 1: us aorta · 0.26mm/px · 14 of 23 slices shown]
[im 1/23]
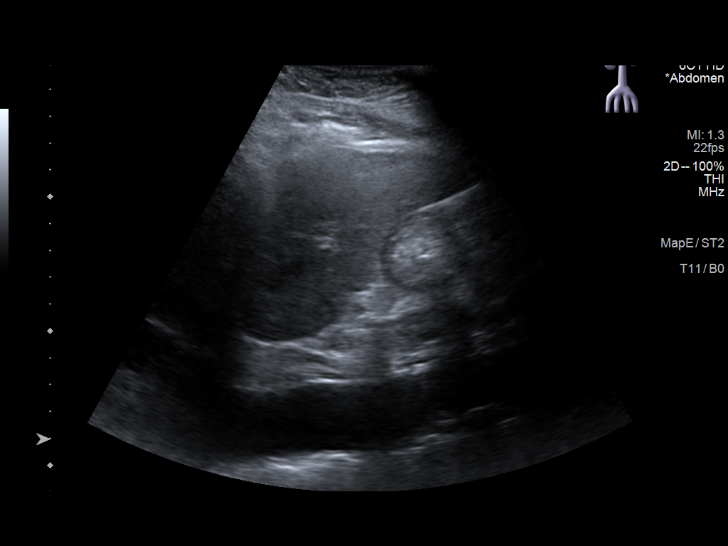
[im 3/23]
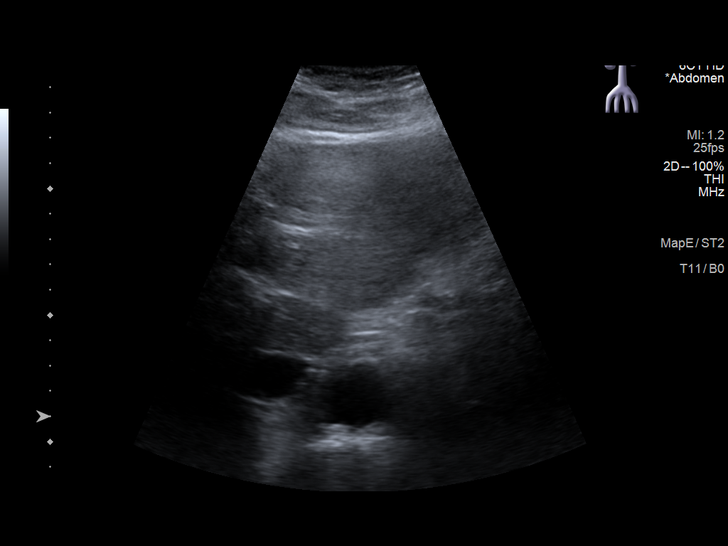
[im 5/23]
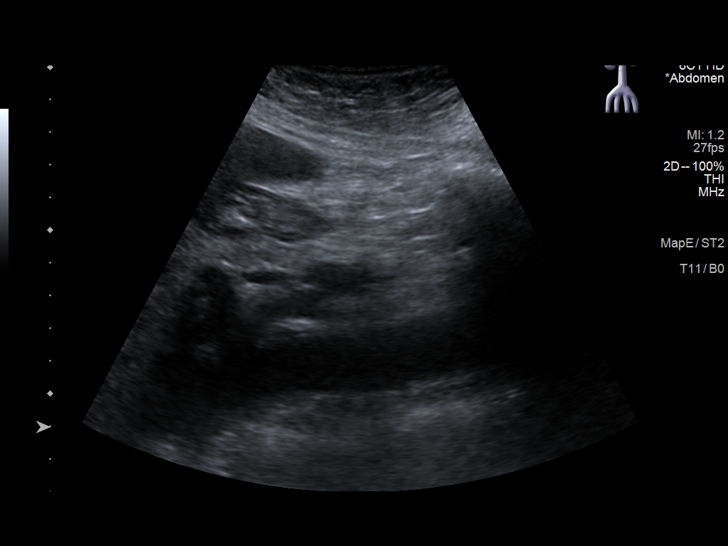
[im 6/23]
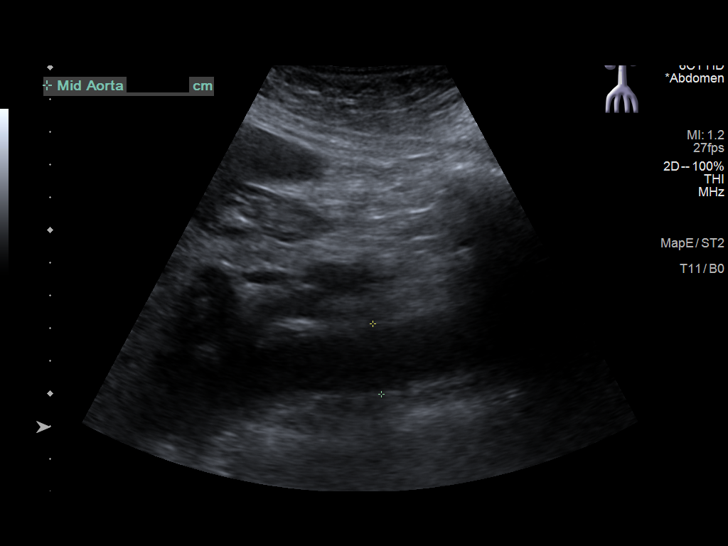
[im 8/23]
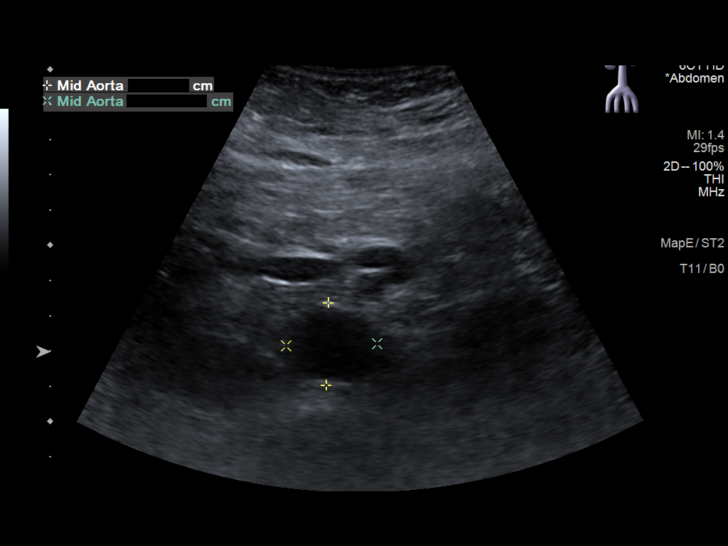
[im 10/23]
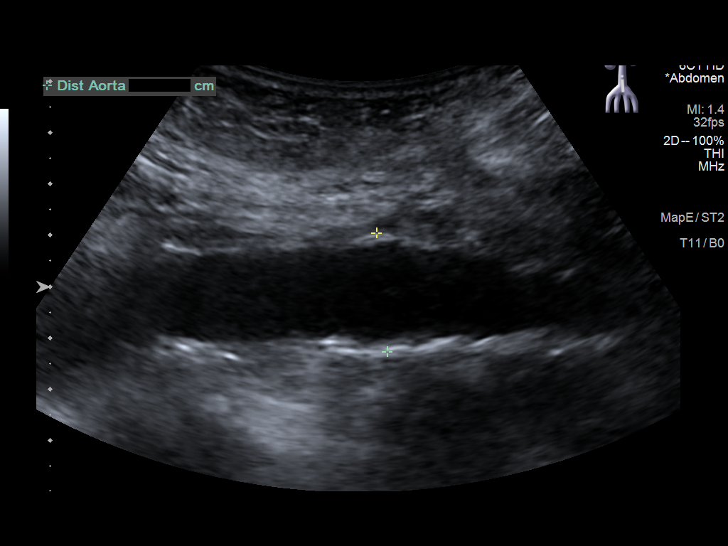
[im 11/23]
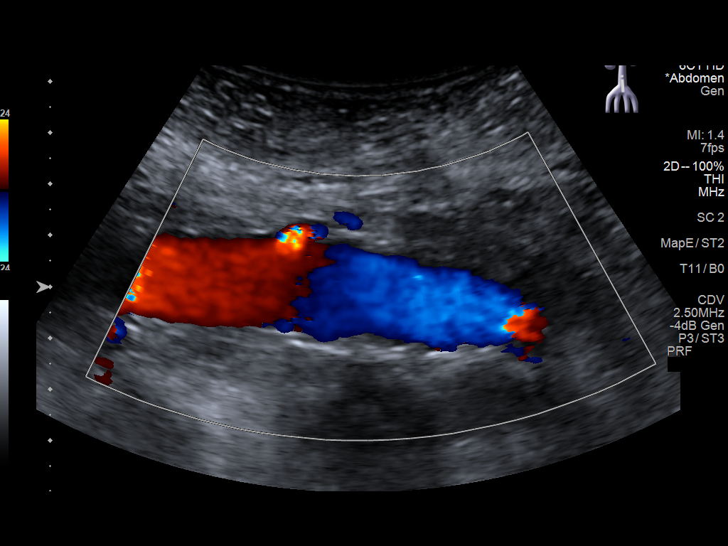
[im 13/23]
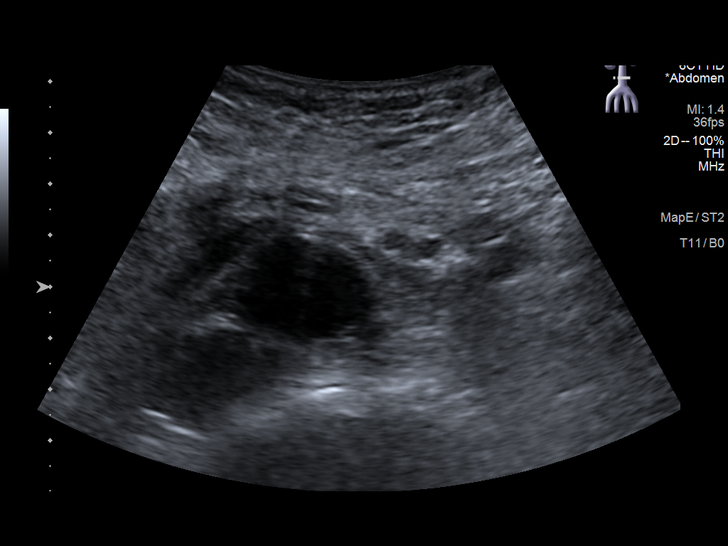
[im 14/23]
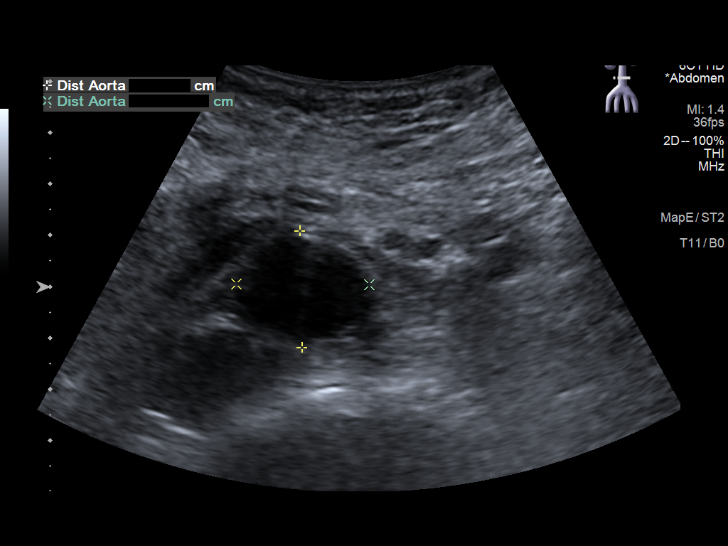
[im 16/23]
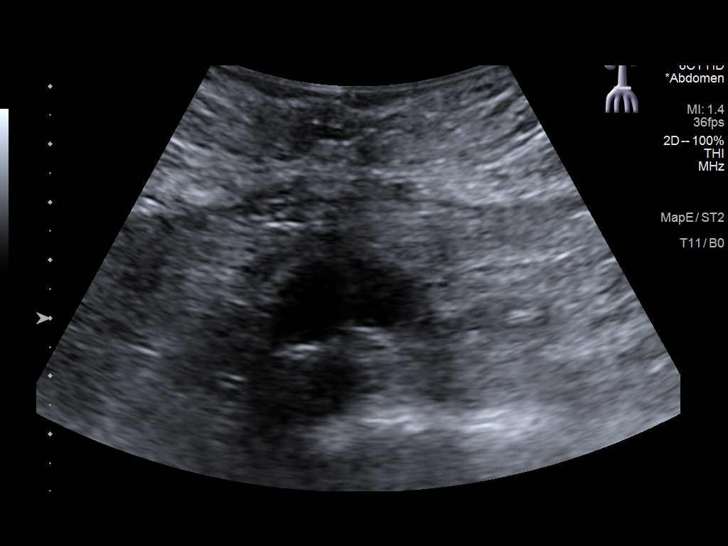
[im 18/23]
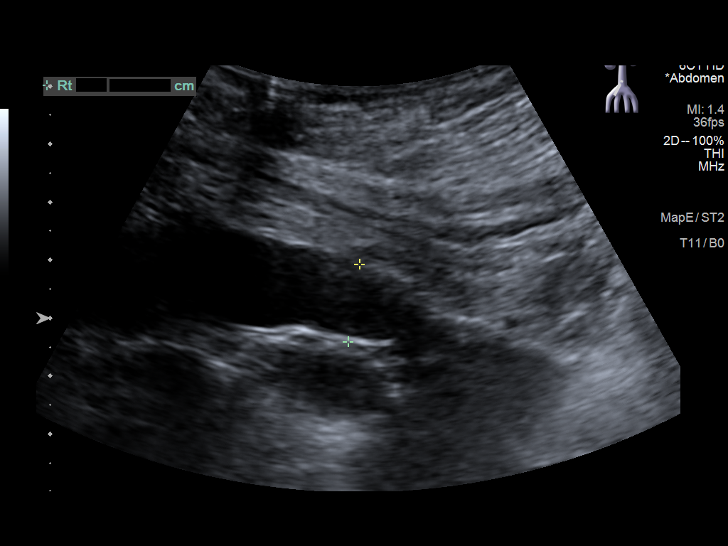
[im 19/23]
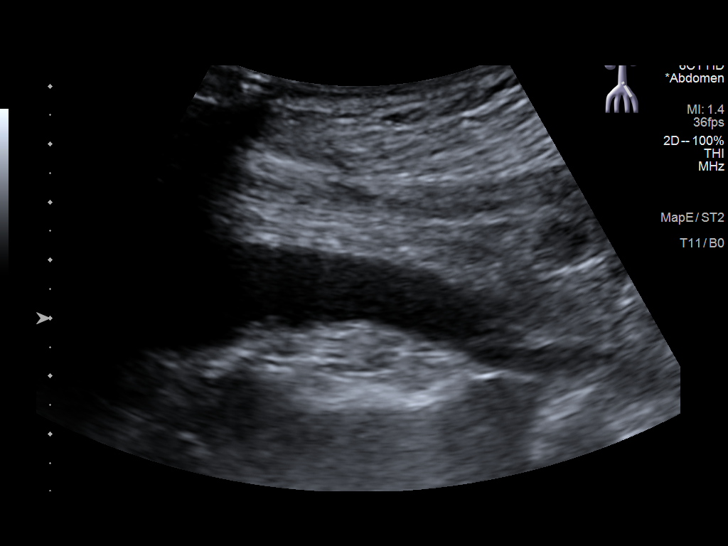
[im 21/23]
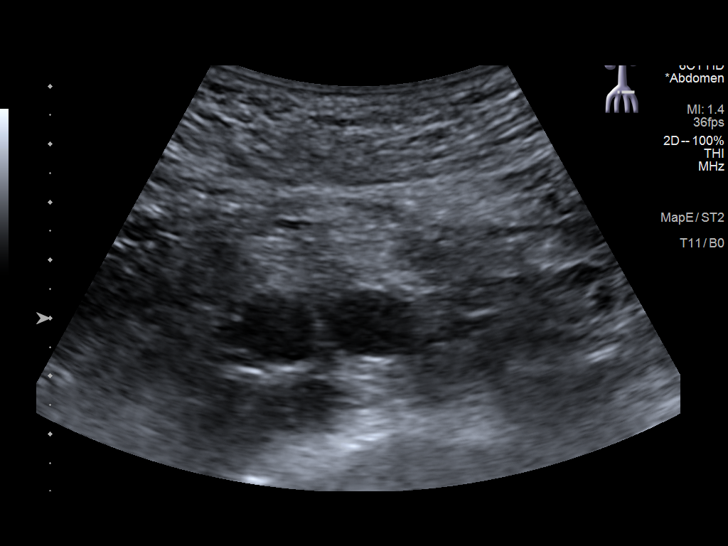
[im 23/23]
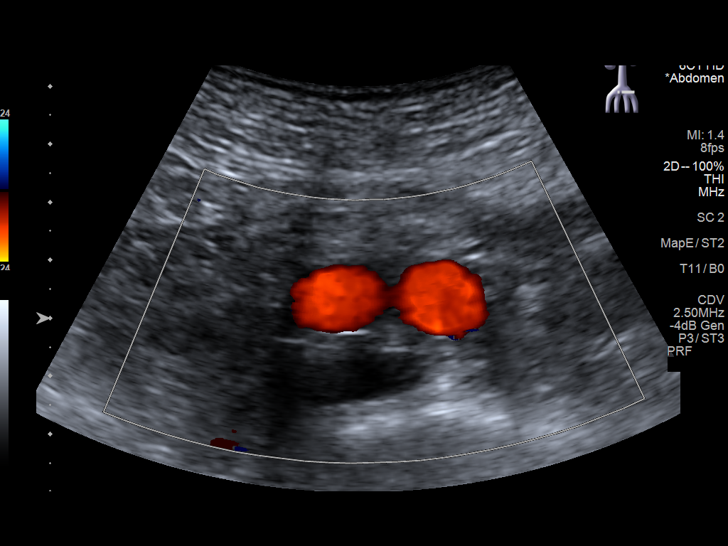

[14 of 23 positions shown; findings below may reference images not displayed]

FINDINGS: Abdominal aortic measurements as follows:

Proximal:  2.8 cm

Mid:  2.6 cm

Distal:  2.6 cm
Patent: Yes, peak systolic velocity is 78 cm/s

Right common iliac artery: 1.4 cm

Left common iliac artery: 1.3 cm
IMPRESSION: There is no significant focal aneurysmal dilation in the abdominal
aorta.
